# Patient Record
Sex: Female | Born: 1995 | Race: Black or African American | Hispanic: No | Marital: Single | Smoking: Never smoker
Health system: Southern US, Community
[De-identification: ages and names within clinical notes are randomized; demographics above are authoritative.]

---

## 2017-06-07 ENCOUNTER — Other Ambulatory Visit: Payer: Self-pay

## 2017-06-07 ENCOUNTER — Encounter (HOSPITAL_COMMUNITY): Payer: Self-pay | Admitting: *Deleted

## 2017-06-07 ENCOUNTER — Ambulatory Visit (HOSPITAL_COMMUNITY)
Admission: EM | Admit: 2017-06-07 | Discharge: 2017-06-07 | Disposition: A | Discharge status: Home / Self Care | Payer: PRIVATE HEALTH INSURANCE

## 2017-06-07 DIAGNOSIS — R1084 Generalized abdominal pain: Secondary | ICD-10-CM | POA: Diagnosis not present

## 2017-06-07 NOTE — ED Triage Notes (Signed)
Reports waking up with intermittent upper abd pain and mid back pain.  Reports improvement when standing.  Denies fever.  C/O nausea, but denies vomiting or diarrhea.

## 2017-06-07 NOTE — Discharge Instructions (Signed)
Please get some MiraLAX and pursue a bowel prep.  Come back if you are having any problems.

## 2017-06-07 NOTE — ED Provider Notes (Signed)
06/07/2017 6:20 PM   DOB: 05/23/1995 / MRN: [id number]  SUBJECTIVE:  Maria Palmer is a 22 y.o. female presenting for abdominal pain.  States the pain is generalized.  Also states the pain waxes and wanes.  She denies fever, chills, nausea.  Food or drink do not make the pain worse.  Last bowel movement was this morning and largely normal for her.  Denies any difficulty with urination and is not urinating more frequently or urgently.  She did drink alcohol 3 nights ago.  She has never had surgery on her abdomen.  She has No Known Allergies.   She  has no past medical history on file.    She  reports that  has never smoked. she has never used smokeless tobacco. She reports that she drinks alcohol. She reports that she does not use drugs. She  reports that she does not engage in sexual activity. The patient  has no past surgical history on file.  Her family history includes Hypertension in her father and mother.  Review of Systems  Constitutional: Negative for chills, diaphoresis and fever.  Respiratory: Negative for cough, hemoptysis, sputum production, shortness of breath and wheezing.   Cardiovascular: Negative for chest pain, orthopnea and leg swelling.  Gastrointestinal: Positive for abdominal pain. Negative for blood in stool, constipation, diarrhea, heartburn, melena, nausea and vomiting.  Genitourinary: Negative for flank pain.  Skin: Negative for rash.  Neurological: Negative for dizziness.    OBJECTIVE:  BP 136/84   Pulse (!) 110   Temp 98.3 F (36.8 C) (Oral)   Resp 18   LMP 05/31/2017 (Approximate)   SpO2 100%   Physical Exam  Constitutional: She is active.  Non-toxic appearance.  Cardiovascular: Normal rate, regular rhythm, S1 normal, S2 normal, normal heart sounds and intact distal pulses. Exam reveals no gallop, no friction rub and no decreased pulses.  No murmur heard. Pulmonary/Chest: Effort normal. No stridor. No tachypnea. No respiratory distress. She has no  wheezes. She has no rales.  Abdominal: Soft. Normal appearance and bowel sounds are normal. She exhibits no distension and no mass. There is no tenderness. There is no rigidity, no rebound, no guarding and no CVA tenderness.  Musculoskeletal: She exhibits no edema.  Neurological: She is alert.  Skin: Skin is warm and dry. She is not diaphoretic. No pallor.    No results found for this or any previous visit (from the past 72 hour(s)).  No results found.  ASSESSMENT AND PLAN:  No orders of the defined types were placed in this encounter.    Generalized abdominal pain: She is well-appearing and has a very reassuring abdominal exam.  I rechecked her heart rate on auscultation and this was 96 bpm.  I have a very low suspicion for pancreatitis, cholecystitis.  Advised that she pursue a bland diet for the next few days and try some MiraLAX.  RTC if she is not improving.      The patient is advised to call or return to clinic if she does not see an improvement in symptoms, or to seek the care of the closest emergency department if she worsens with the above plan.   Deliah BostonMichael Kaaren Nass, MHS, PA-C 06/07/2017 6:20 PM    Ofilia Neaslark, Caria Transue L, PA-C 06/07/17 1821

## 2019-03-13 ENCOUNTER — Other Ambulatory Visit: Payer: Self-pay

## 2019-03-13 ENCOUNTER — Observation Stay (HOSPITAL_COMMUNITY)
Admission: EM | Admit: 2019-03-13 | Discharge: 2019-03-15 | Disposition: A | Discharge status: Home / Self Care | Payer: BC Managed Care – PPO | Attending: General Surgery | Admitting: General Surgery

## 2019-03-13 ENCOUNTER — Encounter (HOSPITAL_COMMUNITY): Payer: Self-pay

## 2019-03-13 DIAGNOSIS — K219 Gastro-esophageal reflux disease without esophagitis: Secondary | ICD-10-CM | POA: Diagnosis not present

## 2019-03-13 DIAGNOSIS — Z20828 Contact with and (suspected) exposure to other viral communicable diseases: Secondary | ICD-10-CM | POA: Diagnosis not present

## 2019-03-13 DIAGNOSIS — Z23 Encounter for immunization: Secondary | ICD-10-CM | POA: Insufficient documentation

## 2019-03-13 DIAGNOSIS — K8020 Calculus of gallbladder without cholecystitis without obstruction: Secondary | ICD-10-CM | POA: Diagnosis present

## 2019-03-13 DIAGNOSIS — R7401 Elevation of levels of liver transaminase levels: Secondary | ICD-10-CM

## 2019-03-13 DIAGNOSIS — K760 Fatty (change of) liver, not elsewhere classified: Secondary | ICD-10-CM | POA: Diagnosis not present

## 2019-03-13 DIAGNOSIS — Z6841 Body Mass Index (BMI) 40.0 and over, adult: Secondary | ICD-10-CM | POA: Diagnosis not present

## 2019-03-13 DIAGNOSIS — R7989 Other specified abnormal findings of blood chemistry: Secondary | ICD-10-CM | POA: Diagnosis not present

## 2019-03-13 DIAGNOSIS — R1010 Upper abdominal pain, unspecified: Secondary | ICD-10-CM

## 2019-03-13 DIAGNOSIS — Z791 Long term (current) use of non-steroidal anti-inflammatories (NSAID): Secondary | ICD-10-CM | POA: Insufficient documentation

## 2019-03-13 DIAGNOSIS — Z8249 Family history of ischemic heart disease and other diseases of the circulatory system: Secondary | ICD-10-CM | POA: Diagnosis not present

## 2019-03-13 DIAGNOSIS — K8010 Calculus of gallbladder with chronic cholecystitis without obstruction: Secondary | ICD-10-CM | POA: Diagnosis not present

## 2019-03-13 DIAGNOSIS — Z8711 Personal history of peptic ulcer disease: Secondary | ICD-10-CM | POA: Insufficient documentation

## 2019-03-13 DIAGNOSIS — K8050 Calculus of bile duct without cholangitis or cholecystitis without obstruction: Secondary | ICD-10-CM | POA: Diagnosis present

## 2019-03-13 LAB — URINALYSIS, ROUTINE W REFLEX MICROSCOPIC
Bacteria, UA: NONE SEEN
Bilirubin Urine: NEGATIVE
Glucose, UA: NEGATIVE mg/dL
Hgb urine dipstick: NEGATIVE
Ketones, ur: NEGATIVE mg/dL
Nitrite: NEGATIVE
Protein, ur: 100 mg/dL — AB
Specific Gravity, Urine: 1.03 (ref 1.005–1.030)
pH: 7 (ref 5.0–8.0)

## 2019-03-13 MED ORDER — SODIUM CHLORIDE 0.9% FLUSH: 3.0000 mL | Freq: Once | INTRAVENOUS | Status: DC

## 2019-03-13 NOTE — ED Triage Notes (Signed)
Patient arrived with complaints of upper mid abdominal pain that started after eating this afternoon around 330pm. States she has had a stomach ulcer in the past and this is presenting the same. Reports some nausea, declines any vomiting or diarrhea.

## 2019-03-13 NOTE — ED Provider Notes (Signed)
[facility] DEPT Provider Note   CSN: [id number] Arrival date & time: 03/13/19  1943     History Chief Complaint  Patient presents with  . Abdominal Pain    Maria Palmer is a 23 y.o. female.  The history is provided by the patient and medical records.    23 year old female with history of peptic ulcer disease, presenting to the ED with abdominal pain.  Patient states she went out to light breakfast with friends today, ate pancakes, bacon, eggs, toast, etc.  States shortly after eating she had onset of epigastric abdominal pain, sharp, stabbing in nature with some radiation to the back.  She reports nausea but denies vomiting.  She states it feels just like when she had an ulcer back in September 2020.  She denies fever/chills.  States she was temporarily on medications for acid reflux, has run out and not currently on anything.  States since arrival in the ED symptoms have resolved.  She did admit to drinking alcohol recently when out with friends for her birthday and thinks she aggravated her PUD.  History reviewed. No pertinent past medical history.  There are no problems to display for this patient.   History reviewed. No pertinent surgical history.   OB History   No obstetric history on file.     Family History  Problem Relation Age of Onset  . Hypertension Mother   . Hypertension Father     Social History   Tobacco Use  . Smoking status: Never Smoker  . Smokeless tobacco: Never Used  . Tobacco comment: occasionally smokes [place]  Substance Use Topics  . Alcohol use: Yes    Comment: occasionally  . Drug use: No    Home Medications Prior to Admission medications   Not on File    Allergies    Patient has no known allergies.  Review of Systems   Review of Systems  Gastrointestinal: Positive for abdominal pain and nausea.  All other systems reviewed and are negative.   Physical Exam Updated Vital Signs BP (!) 142/87  (BP Location: Left Arm)   Pulse (!) 122   Temp 98.8 F (37.1 C)   Resp 16   LMP 02/21/2019   SpO2 100%   Physical Exam Vitals and nursing note reviewed.  Constitutional:      Appearance: She is well-developed.  HENT:     Head: Normocephalic and atraumatic.  Eyes:     Conjunctiva/sclera: Conjunctivae normal.     Pupils: Pupils are equal, round, and reactive to light.  Cardiovascular:     Rate and Rhythm: Normal rate and regular rhythm.     Heart sounds: Normal heart sounds.  Pulmonary:     Effort: Pulmonary effort is normal.     Breath sounds: Normal breath sounds.  Abdominal:     General: Bowel sounds are normal.     Palpations: Abdomen is soft.     Tenderness: There is no abdominal tenderness. There is no rebound.     Comments: Soft, non-tender, no peritoneal signs  Musculoskeletal:        General: Normal range of motion.     Cervical back: Normal range of motion.  Skin:    General: Skin is warm and dry.  Neurological:     Mental Status: She is alert and oriented to person, place, and time.     ED Results / Procedures / Treatments   Labs (all labs ordered are listed, but only abnormal results are displayed) Labs  Reviewed  COMPREHENSIVE METABOLIC PANEL - Abnormal; Notable for the following components:      Result Value   Glucose, Bld 158 (*)    Total Protein 9.6 (*)    AST 477 (*)    ALT 309 (*)    All other components within normal limits  URINALYSIS, ROUTINE W REFLEX MICROSCOPIC - Abnormal; Notable for the following components:   Color, Urine AMBER (*)    APPearance HAZY (*)    Protein, ur 100 (*)    Leukocytes,Ua TRACE (*)    All other components within normal limits  CBC - Abnormal; Notable for the following components:   WBC 17.2 (*)    RBC 5.28 (*)    Platelets 143 (*)    All other components within normal limits  SARS CORONAVIRUS 2 (TAT 6-24 HRS)  LIPASE, BLOOD  COMPREHENSIVE METABOLIC PANEL  HIV ANTIBODY (ROUTINE TESTING W REFLEX)  CBC  I-STAT  BETA HCG BLOOD, ED ([facility], [facility], [facility] ONLY)    EKG None  Radiology US Abdomen Limited RUQ  Result Date: 03/14/2019 CLINICAL DATA:  Upper abdominal pain. Pain after eating. Elevated LFTs. EXAM: ULTRASOUND ABDOMEN LIMITED RIGHT UPPER QUADRANT COMPARISON:  None. FINDINGS: Gallbladder: Partially distended. Multiple intraluminal gallstones. No gallbladder wall thickening or pericholecystic fluid. No sonographic Murphy sign noted by sonographer. Common bile duct: Diameter: 6-7 mm, upper normal.  No visualized choledocholithiasis. Liver: No focal lesion identified. Heterogeneous slightly increased in parenchymal echogenicity. Portal vein is patent on color Doppler imaging with normal direction of blood flow towards the liver. Other: None. IMPRESSION: 1. Multiple gallstones. No secondary findings of acute cholecystitis. 2. Upper normal common bile duct measuring 6-7 mm. No visualized choledocholithiasis. Consider further evaluation with MRCP given elevated LFTs. 3. Hepatic steatosis. Electronically Signed   By: Keith Rake M.D.   On: 03/14/2019 02:26    Procedures Procedures (including critical care time)  Medications Ordered in ED Medications  acetaminophen (TYLENOL) tablet 650 mg (has no administration in time range)    Or  acetaminophen (TYLENOL) suppository 650 mg (has no administration in time range)  ondansetron (ZOFRAN) tablet 4 mg (has no administration in time range)    Or  ondansetron (ZOFRAN) injection 4 mg (has no administration in time range)  0.9 %  sodium chloride infusion (has no administration in time range)  Ampicillin-Sulbactam (UNASYN) 3 g in sodium chloride 0.9 % 100 mL IVPB (has no administration in time range)    ED Course  I have reviewed the triage vital signs and the nursing notes.  Pertinent labs & imaging results that were available during my care of the patient were reviewed by me and considered in my medical decision making (see chart for details).    MDM  Rules/Calculators/A&P  23 year old female here with upper abdominal pain after eating brunch today with friends.  States has a history of peptic ulcer in the past and this feels similar.  Pain resolved prior to my evaluation.  Reports pain was epigastric in nature with some radiation to the back.  She did have some nausea but no vomiting.  Her abdomen is soft and benign at this time.  Labs are pending.  Labs reviewed, does have leukocytosis of 17.2K along with transaminitis-- AST 477, ALT 309.  Normal alk phos and bili.  Denies heavy EtOH use in the past week or so.  No tylenol use.  Not on statins.  No hx of elevated LFT's in the past.  Will obtain RUQ [place].  2:51 AM  [place] results reviewed--- questionable choledocholithiasis given dilated common bile duct and elevated LFTs.  Recommended MRCP.  Patient reassessed, she is still not having any discomfort and vitals are stable.  She does not currently have a PCP nor a GI physician.  We will plan for admission to expedite evaluation by GI +/- general surgery and arrange outpatient follow-up.  Discussed with Dr. Alcario Drought-- he will admit for ongoing care.  COVID screen pending but currently asymptomatic.  Final Clinical Impression(s) / ED Diagnoses Final diagnoses:  Upper abdominal pain  Gallstones  Transaminitis    Rx / DC Orders ED Discharge Orders    None       Larene Pickett, PA-C 03/14/19 Miquel Dunn, MD 03/14/19 [phone]

## 2019-03-13 NOTE — ED Notes (Signed)
Attempted to draw blood in triage but was unsuccessful

## 2019-03-14 ENCOUNTER — Observation Stay (HOSPITAL_COMMUNITY): Payer: BC Managed Care – PPO

## 2019-03-14 ENCOUNTER — Encounter (HOSPITAL_COMMUNITY)
Admission: EM | Disposition: A | Discharge status: Home / Self Care | Payer: Self-pay | Source: Home / Self Care | Attending: Emergency Medicine

## 2019-03-14 ENCOUNTER — Observation Stay (HOSPITAL_COMMUNITY): Payer: BC Managed Care – PPO | Admitting: Certified Registered Nurse Anesthetist

## 2019-03-14 ENCOUNTER — Emergency Department (HOSPITAL_COMMUNITY): Payer: BC Managed Care – PPO

## 2019-03-14 ENCOUNTER — Encounter (HOSPITAL_COMMUNITY): Payer: Self-pay | Admitting: Internal Medicine

## 2019-03-14 DIAGNOSIS — R7401 Elevation of levels of liver transaminase levels: Secondary | ICD-10-CM | POA: Diagnosis not present

## 2019-03-14 DIAGNOSIS — R1010 Upper abdominal pain, unspecified: Secondary | ICD-10-CM | POA: Diagnosis not present

## 2019-03-14 DIAGNOSIS — K8020 Calculus of gallbladder without cholecystitis without obstruction: Secondary | ICD-10-CM

## 2019-03-14 DIAGNOSIS — K8050 Calculus of bile duct without cholangitis or cholecystitis without obstruction: Secondary | ICD-10-CM

## 2019-03-14 HISTORY — PX: CHOLECYSTECTOMY: SHX55

## 2019-03-14 LAB — COMPREHENSIVE METABOLIC PANEL
ALT: 309 U/L — ABNORMAL HIGH (ref 0–44)
ALT: 249 U/L — ABNORMAL HIGH (ref 0–44)
AST: 477 U/L — ABNORMAL HIGH (ref 15–41)
AST: 246 U/L — ABNORMAL HIGH (ref 15–41)
Albumin: 4.7 g/dL (ref 3.5–5.0)
Albumin: 3.8 g/dL (ref 3.5–5.0)
Alkaline Phosphatase: 105 U/L (ref 38–126)
Alkaline Phosphatase: 85 U/L (ref 38–126)
Anion gap: 8 (ref 5–15)
Anion gap: 9 (ref 5–15)
BUN: 9 mg/dL (ref 6–20)
BUN: 8 mg/dL (ref 6–20)
CO2: 28 mmol/L (ref 22–32)
CO2: 26 mmol/L (ref 22–32)
Calcium: 10.2 mg/dL (ref 8.9–10.3)
Calcium: 9.4 mg/dL (ref 8.9–10.3)
Chloride: 104 mmol/L (ref 98–111)
Chloride: 105 mmol/L (ref 98–111)
Creatinine, Ser: 0.5 mg/dL (ref 0.44–1.00)
Creatinine, Ser: 0.66 mg/dL (ref 0.44–1.00)
GFR calc Af Amer: >60 mL/min (ref >60)
GFR calc Af Amer: >60 mL/min (ref >60)
GFR calc non Af Amer: >60 mL/min (ref >60)
GFR calc non Af Amer: >60 mL/min (ref >60)
Glucose, Bld: 158 mg/dL — ABNORMAL HIGH (ref 70–99)
Glucose, Bld: 118 mg/dL — ABNORMAL HIGH (ref 70–99)
Potassium: 3.7 mmol/L (ref 3.5–5.1)
Potassium: 3.8 mmol/L (ref 3.5–5.1)
Sodium: 140 mmol/L (ref 135–145)
Sodium: 140 mmol/L (ref 135–145)
Total Bilirubin: 0.9 mg/dL (ref 0.3–1.2)
Total Bilirubin: 0.8 mg/dL (ref 0.3–1.2)
Total Protein: 9.6 g/dL — ABNORMAL HIGH (ref 6.5–8.1)
Total Protein: 7.9 g/dL (ref 6.5–8.1)

## 2019-03-14 LAB — CBC
HCT: 44.9 % (ref 36.0–46.0)
HCT: 37.2 % (ref 36.0–46.0)
Hemoglobin: 14 g/dL (ref 12.0–15.0)
Hemoglobin: 11.3 g/dL — ABNORMAL LOW (ref 12.0–15.0)
MCH: 26.5 pg (ref 26.0–34.0)
MCH: 25.6 pg — ABNORMAL LOW (ref 26.0–34.0)
MCHC: 31.2 g/dL (ref 30.0–36.0)
MCHC: 30.4 g/dL (ref 30.0–36.0)
MCV: 85 fL (ref 80.0–100.0)
MCV: 84.4 fL (ref 80.0–100.0)
Platelets: 143 10*3/uL — ABNORMAL LOW (ref 150–400)
Platelets: 173 10*3/uL (ref 150–400)
RBC: 5.28 MIL/uL — ABNORMAL HIGH (ref 3.87–5.11)
RBC: 4.41 MIL/uL (ref 3.87–5.11)
RDW: 14.2 % (ref 11.5–15.5)
RDW: 14.2 % (ref 11.5–15.5)
WBC: 17.2 10*3/uL — ABNORMAL HIGH (ref 4.0–10.5)
WBC: 13.2 10*3/uL — ABNORMAL HIGH (ref 4.0–10.5)
nRBC: 0 % (ref 0.0–0.2)
nRBC: 0 % (ref 0.0–0.2)

## 2019-03-14 LAB — SARS CORONAVIRUS 2 (TAT 6-24 HRS): SARS Coronavirus 2: NEGATIVE

## 2019-03-14 LAB — HIV ANTIBODY (ROUTINE TESTING W REFLEX): HIV Screen 4th Generation wRfx: NONREACTIVE

## 2019-03-14 LAB — LIPASE, BLOOD: Lipase: 22 U/L (ref 11–51)

## 2019-03-14 LAB — I-STAT BETA HCG BLOOD, ED (MC, WL, AP ONLY): I-stat hCG, quantitative: <5 m[IU]/mL (ref <5)

## 2019-03-14 SURGERY — LAPAROSCOPIC CHOLECYSTECTOMY WITH INTRAOPERATIVE CHOLANGIOGRAM
Anesthesia: General

## 2019-03-14 MED ORDER — ACETAMINOPHEN 650 MG RE SUPP: 650.0000 mg | Freq: Four times a day (QID) | RECTAL | Status: DC | PRN

## 2019-03-14 MED ORDER — INFLUENZA VAC SPLIT QUAD 0.5 ML IM SUSY
0.5000 mL | PREFILLED_SYRINGE | INTRAMUSCULAR | Status: AC
  Administered 2019-03-15: 0.5 mL via INTRAMUSCULAR

## 2019-03-14 MED ORDER — FENTANYL CITRATE (PF) 100 MCG/2ML IJ SOLN
25.0000 ug | INTRAMUSCULAR | Status: DC | PRN
  Administered 2019-03-14 (×3): 50 ug via INTRAVENOUS

## 2019-03-14 MED ORDER — ROCURONIUM BROMIDE 50 MG/5ML IV SOSY
PREFILLED_SYRINGE | INTRAVENOUS | Status: DC | PRN
  Administered 2019-03-14: 5 mg via INTRAVENOUS
  Administered 2019-03-14: 50 mg via INTRAVENOUS
  Administered 2019-03-14: 15 mg via INTRAVENOUS

## 2019-03-14 MED ORDER — ONDANSETRON HCL 4 MG/2ML IJ SOLN: 4.0000 mg | Freq: Four times a day (QID) | INTRAMUSCULAR | Status: DC | PRN

## 2019-03-14 MED ORDER — SODIUM CHLORIDE 0.9 % IV SOLN
INTRAVENOUS | Status: DC
  Administered 2019-03-14 (×2): via INTRAVENOUS

## 2019-03-14 MED ORDER — OXYCODONE HCL 5 MG PO TABS: 5.0000 mg | ORAL_TABLET | ORAL | Status: DC | PRN

## 2019-03-14 MED ORDER — ACETAMINOPHEN 325 MG PO TABS: 650.0000 mg | ORAL_TABLET | Freq: Four times a day (QID) | ORAL | Status: DC | PRN

## 2019-03-14 MED ORDER — PROPOFOL 10 MG/ML IV BOLUS
INTRAVENOUS | Status: DC | PRN
  Administered 2019-03-14: 200 mg via INTRAVENOUS

## 2019-03-14 MED ORDER — OXYCODONE HCL 5 MG/5ML PO SOLN: 5.0000 mg | Freq: Once | ORAL | Status: DC | PRN

## 2019-03-14 MED ORDER — BUPIVACAINE HCL (PF) 0.25 % IJ SOLN
INTRAMUSCULAR | Status: DC | PRN
  Administered 2019-03-14: 30 mL

## 2019-03-14 MED ORDER — MIDAZOLAM HCL 5 MG/5ML IJ SOLN
INTRAMUSCULAR | Status: DC | PRN
  Administered 2019-03-14: 2 mg via INTRAVENOUS

## 2019-03-14 MED ORDER — LIP MEDEX EX OINT
TOPICAL_OINTMENT | CUTANEOUS | Status: AC | PRN
  Administered 2019-03-14: 1 via TOPICAL
  Filled 2019-03-14: qty 7

## 2019-03-14 MED ORDER — HYDROMORPHONE HCL 1 MG/ML IJ SOLN
0.2500 mg | INTRAMUSCULAR | Status: DC | PRN
  Administered 2019-03-14 (×3): 0.5 mg via INTRAVENOUS

## 2019-03-14 MED ORDER — FENTANYL CITRATE (PF) 250 MCG/5ML IJ SOLN
INTRAMUSCULAR | Status: AC
  Filled 2019-03-14: qty 5

## 2019-03-14 MED ORDER — LACTATED RINGERS IV SOLN
INTRAVENOUS | Status: AC | PRN
  Administered 2019-03-14: 1000 mL

## 2019-03-14 MED ORDER — ONDANSETRON HCL 4 MG/2ML IJ SOLN
INTRAMUSCULAR | Status: AC
  Filled 2019-03-14: qty 2

## 2019-03-14 MED ORDER — HYDROMORPHONE HCL 1 MG/ML IJ SOLN
INTRAMUSCULAR | Status: AC
  Filled 2019-03-14: qty 2

## 2019-03-14 MED ORDER — 0.9 % SODIUM CHLORIDE (POUR BTL) OPTIME
TOPICAL | Status: DC | PRN
  Administered 2019-03-14: 1000 mL

## 2019-03-14 MED ORDER — SUGAMMADEX SODIUM 500 MG/5ML IV SOLN
INTRAVENOUS | Status: DC | PRN
  Administered 2019-03-14: 300 mg via INTRAVENOUS

## 2019-03-14 MED ORDER — ONDANSETRON HCL 4 MG PO TABS: 4.0000 mg | ORAL_TABLET | Freq: Four times a day (QID) | ORAL | Status: DC | PRN

## 2019-03-14 MED ORDER — ONDANSETRON HCL 4 MG/2ML IJ SOLN
INTRAMUSCULAR | Status: DC | PRN
  Administered 2019-03-14: 4 mg via INTRAVENOUS

## 2019-03-14 MED ORDER — SODIUM CHLORIDE 0.9% FLUSH: 10.0000 mL | INTRAVENOUS | Status: DC | PRN

## 2019-03-14 MED ORDER — SODIUM CHLORIDE 0.9 % IV SOLN
3.0000 g | Freq: Four times a day (QID) | INTRAVENOUS | Status: DC
  Administered 2019-03-14 (×2): 3 g via INTRAVENOUS
  Filled 2019-03-14 (×2): qty 8
  Filled 2019-03-14: qty 3

## 2019-03-14 MED ORDER — BUPIVACAINE HCL (PF) 0.25 % IJ SOLN
INTRAMUSCULAR | Status: AC
  Filled 2019-03-14: qty 30

## 2019-03-14 MED ORDER — ACETAMINOPHEN 500 MG PO TABS: 1000.0000 mg | ORAL_TABLET | ORAL | Status: DC

## 2019-03-14 MED ORDER — LIDOCAINE 2% (20 MG/ML) 5 ML SYRINGE
INTRAMUSCULAR | Status: DC | PRN
  Administered 2019-03-14: 100 mg via INTRAVENOUS

## 2019-03-14 MED ORDER — ROCURONIUM BROMIDE 10 MG/ML (PF) SYRINGE
PREFILLED_SYRINGE | INTRAVENOUS | Status: AC
  Filled 2019-03-14: qty 10

## 2019-03-14 MED ORDER — FENTANYL CITRATE (PF) 250 MCG/5ML IJ SOLN
INTRAMUSCULAR | Status: DC | PRN
  Administered 2019-03-14 (×4): 50 ug via INTRAVENOUS

## 2019-03-14 MED ORDER — MIDAZOLAM HCL 2 MG/2ML IJ SOLN
INTRAMUSCULAR | Status: AC
  Filled 2019-03-14: qty 2

## 2019-03-14 MED ORDER — IBUPROFEN 400 MG PO TABS
600.0000 mg | ORAL_TABLET | Freq: Three times a day (TID) | ORAL | Status: DC
  Administered 2019-03-14 – 2019-03-15 (×3): 600 mg via ORAL
  Filled 2019-03-14 (×3): qty 1

## 2019-03-14 MED ORDER — MORPHINE SULFATE (PF) 2 MG/ML IV SOLN: 1.0000 mg | INTRAVENOUS | Status: DC | PRN

## 2019-03-14 MED ORDER — ALUM & MAG HYDROXIDE-SIMETH 200-200-20 MG/5ML PO SUSP
30.0000 mL | ORAL | Status: DC | PRN
  Administered 2019-03-14: 30 mL via ORAL
  Filled 2019-03-14: qty 30

## 2019-03-14 MED ORDER — PROPOFOL 10 MG/ML IV BOLUS
INTRAVENOUS | Status: AC
  Filled 2019-03-14: qty 20

## 2019-03-14 MED ORDER — SODIUM CHLORIDE 0.9 % IV SOLN
INTRAVENOUS | Status: DC | PRN
  Administered 2019-03-14: 4 mL

## 2019-03-14 MED ORDER — LACTATED RINGERS IV SOLN
INTRAVENOUS | Status: DC
  Administered 2019-03-14: 14:00:00 via INTRAVENOUS

## 2019-03-14 MED ORDER — SODIUM CHLORIDE 0.9 % IV SOLN
INTRAVENOUS | Status: DC
  Administered 2019-03-14: 18:00:00 via INTRAVENOUS

## 2019-03-14 MED ORDER — SODIUM CHLORIDE 0.9% FLUSH
10.0000 mL | Freq: Two times a day (BID) | INTRAVENOUS | Status: DC
  Administered 2019-03-14: 10 mL

## 2019-03-14 MED ORDER — ENOXAPARIN SODIUM 40 MG/0.4ML ~~LOC~~ SOLN: 40.0000 mg | SUBCUTANEOUS | Status: DC

## 2019-03-14 MED ORDER — LIDOCAINE 2% (20 MG/ML) 5 ML SYRINGE
INTRAMUSCULAR | Status: AC
  Filled 2019-03-14: qty 5

## 2019-03-14 MED ORDER — ACETAMINOPHEN 500 MG PO TABS
1000.0000 mg | ORAL_TABLET | Freq: Four times a day (QID) | ORAL | Status: AC
  Administered 2019-03-14: 1000 mg via ORAL
  Filled 2019-03-14: qty 2

## 2019-03-14 MED ORDER — OXYCODONE HCL 5 MG PO TABS: 5.0000 mg | ORAL_TABLET | Freq: Once | ORAL | Status: DC | PRN

## 2019-03-14 MED ORDER — SUGAMMADEX SODIUM 500 MG/5ML IV SOLN
INTRAVENOUS | Status: AC
  Filled 2019-03-14: qty 5

## 2019-03-14 MED ORDER — DEXAMETHASONE SODIUM PHOSPHATE 10 MG/ML IJ SOLN
INTRAMUSCULAR | Status: AC
  Filled 2019-03-14: qty 1

## 2019-03-14 MED ORDER — DEXAMETHASONE SODIUM PHOSPHATE 10 MG/ML IJ SOLN
INTRAMUSCULAR | Status: DC | PRN
  Administered 2019-03-14: 10 mg via INTRAVENOUS

## 2019-03-14 MED ORDER — KETOROLAC TROMETHAMINE 15 MG/ML IJ SOLN
INTRAMUSCULAR | Status: DC | PRN
  Administered 2019-03-14: 15 mg via INTRAVENOUS

## 2019-03-14 MED ORDER — FENTANYL CITRATE (PF) 100 MCG/2ML IJ SOLN
INTRAMUSCULAR | Status: AC
  Filled 2019-03-14: qty 4

## 2019-03-14 SURGICAL SUPPLY — 51 items
APPLICATOR ARISTA FLEXITIP XL (MISCELLANEOUS) IMPLANT
APPLIER CLIP 5 13 M/L LIGAMAX5 (MISCELLANEOUS)
APPLIER CLIP ROT 10 11.4 M/L (STAPLE)
BENZOIN TINCTURE PRP APPL 2/3 (GAUZE/BANDAGES/DRESSINGS) IMPLANT
BNDG ADH 1X3 SHEER STRL LF (GAUZE/BANDAGES/DRESSINGS) ×8 IMPLANT
CABLE HIGH FREQUENCY MONO STRZ (ELECTRODE) ×2 IMPLANT
CHLORAPREP W/TINT 26 (MISCELLANEOUS) ×2 IMPLANT
CLIP APPLIE 5 13 M/L LIGAMAX5 (MISCELLANEOUS) IMPLANT
CLIP APPLIE ROT 10 11.4 M/L (STAPLE) IMPLANT
CLIP VESOLOCK MED LG 6/CT (CLIP) IMPLANT
COVER MAYO STAND STRL (DRAPES) IMPLANT
COVER SURGICAL LIGHT HANDLE (MISCELLANEOUS) ×2 IMPLANT
COVER WAND RF STERILE (DRAPES) IMPLANT
DECANTER SPIKE VIAL GLASS SM (MISCELLANEOUS) ×2 IMPLANT
DERMABOND ADVANCED (GAUZE/BANDAGES/DRESSINGS)
DERMABOND ADVANCED .7 DNX12 (GAUZE/BANDAGES/DRESSINGS) IMPLANT
DRAPE C-ARM 42X120 X-RAY (DRAPES) IMPLANT
DRSG TEGADERM 2-3/8X2-3/4 SM (GAUZE/BANDAGES/DRESSINGS) IMPLANT
ELECT REM PT RETURN 15FT ADLT (MISCELLANEOUS) ×2 IMPLANT
GAUZE SPONGE 2X2 8PLY STRL LF (GAUZE/BANDAGES/DRESSINGS) IMPLANT
GLOVE BIO SURGEON STRL SZ7.5 (GLOVE) ×2 IMPLANT
GLOVE INDICATOR 8.0 STRL GRN (GLOVE) ×2 IMPLANT
GOWN STRL REUS W/TWL XL LVL3 (GOWN DISPOSABLE) ×6 IMPLANT
GRASPER SUT TROCAR 14GX15 (MISCELLANEOUS) ×2 IMPLANT
HEMOSTAT ARISTA ABSORB 3G PWDR (HEMOSTASIS) IMPLANT
HEMOSTAT SNOW SURGICEL 2X4 (HEMOSTASIS) IMPLANT
KIT BASIN OR (CUSTOM PROCEDURE TRAY) ×2 IMPLANT
KIT TURNOVER KIT A (KITS) IMPLANT
L-HOOK LAP DISP 36CM (ELECTROSURGICAL)
LHOOK LAP DISP 36CM (ELECTROSURGICAL) IMPLANT
POUCH RETRIEVAL ECOSAC 10 (ENDOMECHANICALS) ×1 IMPLANT
POUCH RETRIEVAL ECOSAC 10MM (ENDOMECHANICALS) ×1
SCISSORS LAP 5X35 DISP (ENDOMECHANICALS) ×2 IMPLANT
SET CHOLANGIOGRAPH MIX (MISCELLANEOUS) IMPLANT
SET IRRIG TUBING LAPAROSCOPIC (IRRIGATION / IRRIGATOR) ×2 IMPLANT
SET TUBE SMOKE EVAC HIGH FLOW (TUBING) ×2 IMPLANT
SLEEVE XCEL OPT CAN 5 100 (ENDOMECHANICALS) ×4 IMPLANT
SPONGE GAUZE 2X2 STER 10/PKG (GAUZE/BANDAGES/DRESSINGS)
STRIP CLOSURE SKIN 1/2X4 (GAUZE/BANDAGES/DRESSINGS) IMPLANT
SUT MNCRL AB 4-0 PS2 18 (SUTURE) ×2 IMPLANT
SUT VIC AB 0 UR5 27 (SUTURE) IMPLANT
SUT VICRYL 0 TIES 12 18 (SUTURE) ×2 IMPLANT
SUT VICRYL 0 UR6 27IN ABS (SUTURE) ×4 IMPLANT
SYR BULB IRRIGATION 50ML (SYRINGE) ×2 IMPLANT
TAPE STRIPS DRAPE STRL (GAUZE/BANDAGES/DRESSINGS) ×2 IMPLANT
TOWEL OR 17X26 10 PK STRL BLUE (TOWEL DISPOSABLE) ×2 IMPLANT
TOWEL OR NON WOVEN STRL DISP B (DISPOSABLE) ×2 IMPLANT
TRAY LAPAROSCOPIC (CUSTOM PROCEDURE TRAY) ×2 IMPLANT
TROCAR BLADELESS OPT 5 100 (ENDOMECHANICALS) ×2 IMPLANT
TROCAR XCEL BLUNT TIP 100MML (ENDOMECHANICALS) IMPLANT
TROCAR XCEL NON-BLD 11X100MML (ENDOMECHANICALS) IMPLANT

## 2019-03-14 NOTE — Progress Notes (Signed)
Pharmacy Antibiotic Note  Maria Palmer is a 23 y.o. female admitted on 03/13/2019 with intra-abdominal infection.  Pharmacy has been consulted for unasyn dosing.  Plan: Unasyn 3 Gm IV q6h F/u scr/cultures     Temp (24hrs), Avg:99 F (37.2 C), Min:98.8 F (37.1 C), Max:99.1 F (37.3 C)  Recent Labs  Lab 03/13/19 2348 03/14/19 0039  WBC  --  17.2*  CREATININE 0.50  --     CrCl cannot be calculated (Unknown ideal weight.).    No Known Allergies  Antimicrobials this admission: 12/14 unasyn >>    >>   Dose adjustments this admission:   Microbiology results:  BCx:   UCx:   Sputum:    MRSA PCR:   Thank you for allowing pharmacy to be a part of this patient's care.  Dorrene German 03/14/2019 3:19 AM

## 2019-03-14 NOTE — H&P (Signed)
History and Physical    Maria Palmer JJO:[id number] DOB: [birth year] DOA: 03/13/2019  PCP: Patient, No Pcp Per  Patient coming from: Home  I have personally briefly reviewed patient's old medical records in [facility]  Chief Complaint: Abd pain  HPI: Aubrionna Istre is a 23 y.o. female with medical history significant of PUD.  Patient presents to ED with c/o abd pain.  Went out to eat breakfast with friends today.  Shortly after had onset of epigastric abd pain.  Sharp, stabbing, some radiation to back.  Had nausea but no vomiting.  No fever/chills.   ED Course: Symptoms resolved in ED.  Tm 99.1, WBC 17.2k.  Lipase nl, AST 477, ALT 309.  RUQ US shows multiple gallstones.  Upper limit of normal CBD but no stone in duct identified.   Review of Systems: As per HPI, otherwise all review of systems negative.  History reviewed. No pertinent past medical history.  History reviewed. No pertinent surgical history.   reports that she has never smoked. She has never used smokeless tobacco. She reports current alcohol use. She reports that she does not use drugs.  No Known Allergies  Family History  Problem Relation Age of Onset  . Hypertension Mother   . Hypertension Father      Prior to Admission medications   Not on File    Physical Exam: Vitals:   03/14/19 0100 03/14/19 0200 03/14/19 0230 03/14/19 0300  BP: (!) 141/90 129/84 126/83 130/73  Pulse: (!) 102 81 86 81  Resp: 18 18 16 20   Temp:      TempSrc:      SpO2: 100% 100%  100%    Constitutional: NAD, calm, comfortable Eyes: PERRL, lids and conjunctivae normal ENMT: Mucous membranes are moist. Posterior pharynx clear of any exudate or lesions.Normal dentition.  Neck: normal, supple, no masses, no thyromegaly Respiratory: clear to auscultation bilaterally, no wheezing, no crackles. Normal respiratory effort. No accessory muscle use.  Cardiovascular: Regular rate and rhythm, no murmurs / rubs /  gallops. No extremity edema. 2+ pedal pulses. No carotid bruits.  Abdomen: no tenderness, no masses palpated. No hepatosplenomegaly. Bowel sounds positive.  Musculoskeletal: no clubbing / cyanosis. No joint deformity upper and lower extremities. Good ROM, no contractures. Normal muscle tone.  Skin: no rashes, lesions, ulcers. No induration Neurologic: CN 2-12 grossly intact. Sensation intact, DTR normal. Strength 5/5 in all 4.  Psychiatric: Normal judgment and insight. Alert and oriented x 3. Normal mood.    Labs on Admission: I have personally reviewed following labs and imaging studies  CBC: Recent Labs  Lab 03/14/19 0039  WBC 17.2*  HGB 14.0  HCT 44.9  MCV 85.0  PLT 143*   Basic Metabolic Panel: Recent Labs  Lab 03/13/19 2348  NA 140  K 3.7  CL 104  CO2 28  GLUCOSE 158*  BUN 9  CREATININE 0.50  CALCIUM 10.2   GFR: CrCl cannot be calculated (Unknown ideal weight.). Liver Function Tests: Recent Labs  Lab 03/13/19 2348  AST 477*  ALT 309*  ALKPHOS 105  BILITOT 0.9  PROT 9.6*  ALBUMIN 4.7   Recent Labs  Lab 03/13/19 2348  LIPASE 22   No results for input(s): AMMONIA in the last 168 hours. Coagulation Profile: No results for input(s): INR, PROTIME in the last 168 hours. Cardiac Enzymes: No results for input(s): CKTOTAL, CKMB, CKMBINDEX, TROPONINI in the last 168 hours. BNP (last 3 results) No results for input(s): PROBNP in the last 8760 hours.  HbA1C: No results for input(s): HGBA1C in the last 72 hours. CBG: No results for input(s): GLUCAP in the last 168 hours. Lipid Profile: No results for input(s): CHOL, HDL, LDLCALC, TRIG, CHOLHDL, LDLDIRECT in the last 72 hours. Thyroid Function Tests: No results for input(s): TSH, T4TOTAL, FREET4, T3FREE, THYROIDAB in the last 72 hours. Anemia Panel: No results for input(s): VITAMINB12, FOLATE, FERRITIN, TIBC, IRON, RETICCTPCT in the last 72 hours. Urine analysis:    Component Value Date/Time   COLORURINE  AMBER (A) 03/13/2019 2236   APPEARANCEUR HAZY (A) 03/13/2019 2236   LABSPEC 1.030 03/13/2019 2236   PHURINE 7.0 03/13/2019 2236   GLUCOSEU NEGATIVE 03/13/2019 2236   HGBUR NEGATIVE 03/13/2019 2236   BILIRUBINUR NEGATIVE 03/13/2019 2236   KETONESUR NEGATIVE 03/13/2019 2236   PROTEINUR 100 (A) 03/13/2019 2236   NITRITE NEGATIVE 03/13/2019 2236   LEUKOCYTESUR TRACE (A) 03/13/2019 2236    Radiological Exams on Admission: US Abdomen Limited RUQ  Result Date: 03/14/2019 CLINICAL DATA:  Upper abdominal pain. Pain after eating. Elevated LFTs. EXAM: ULTRASOUND ABDOMEN LIMITED RIGHT UPPER QUADRANT COMPARISON:  None. FINDINGS: Gallbladder: Partially distended. Multiple intraluminal gallstones. No gallbladder wall thickening or pericholecystic fluid. No sonographic Murphy sign noted by sonographer. Common bile duct: Diameter: 6-7 mm, upper normal.  No visualized choledocholithiasis. Liver: No focal lesion identified. Heterogeneous slightly increased in parenchymal echogenicity. Portal vein is patent on color Doppler imaging with normal direction of blood flow towards the liver. Other: None. IMPRESSION: 1. Multiple gallstones. No secondary findings of acute cholecystitis. 2. Upper normal common bile duct measuring 6-7 mm. No visualized choledocholithiasis. Consider further evaluation with MRCP given elevated LFTs. 3. Hepatic steatosis. Electronically Signed   By: Keith Rake M.D.   On: 03/14/2019 02:26    EKG: Independently reviewed.  Assessment/Plan Active Problems:   Choledocholithiasis    1. Cholelithiasis with either choledocholithiasis vs passed stone - 1. Repeat CMP in AM 2. Will put on empiric Unasyn for the moment 3. Call GI in AM 1. Presumably may need either ERCP or MRCP 4. NPO 5. IVF: NS at 125  DVT prophylaxis: SCDs Code Status: Full Family Communication: No family in room Disposition Plan: Home after admit Consults called: None Admission status: Place in  46    Claribel Sachs, [place] [facility]  How to contact the [facility] Attending or Consulting provider [place] or covering provider during after hours [place], for this patient?  1. Check the care team in [facility] and look for a) attending/consulting [facility] provider listed and b) the [facility] team listed 2. Log into [email or web address]  Amion Physician Scheduling and messaging for groups and whole hospitals  On call and physician scheduling software for group practices, residents, hospitalists and other medical providers for call, clinic, rotation and shift schedules. OnCall [employer] is a hospital-wide system for scheduling doctors and paging doctors on call. EasyPlot is for scientific plotting and data analysis.  [email or web address]  and use [place]'s universal password to access. If you do not have the password, please contact the hospital operator.  3. Locate the [facility] provider you are looking for under [facility] and page to a number that you can be directly reached. 4. If you still have difficulty reaching the provider, please page the [facility] (Director on Call) for the Hospitalists listed on amion for assistance.  03/14/2019, 3:17 AM

## 2019-03-14 NOTE — Anesthesia Procedure Notes (Signed)
Procedure Name: Intubation Performed by: West Pugh, CRNA Pre-anesthesia Checklist: Patient identified, Emergency Drugs available, Suction available, Patient being monitored and Timeout performed Patient Re-evaluated:Patient Re-evaluated prior to induction Oxygen Delivery Method: Circle system utilized Preoxygenation: Pre-oxygenation with 100% oxygen Induction Type: IV induction Ventilation: Mask ventilation without difficulty and Oral airway inserted - appropriate to patient size Laryngoscope Size: Mac and 4 Grade View: Grade II Tube type: Oral Tube size: 7.0 mm Number of attempts: 1 Airway Equipment and Method: Stylet Placement Confirmation: ETT inserted through vocal cords under direct vision,  positive ETCO2,  CO2 detector and breath sounds checked- equal and bilateral Secured at: 21 cm Tube secured with: Tape Dental Injury: Teeth and Oropharynx as per pre-operative assessment

## 2019-03-14 NOTE — Consult Note (Signed)
Consultation  Referring Provider: [facility]/ Alcario Drought  Primary Care Physician:  Patient, No Pcp Per Primary Gastroenterologist:  None/unassigned.  Reason for Consultation: Acute severe epigastric pain  HPI: Maria Palmer is a 23 y.o. female, who was admitted to the hospital last evening after she presented with acute onset of severe epigastric pain about 2 hours after she had eaten lunch.  Pain was constant and radiating into her back.  She had associated nausea but no vomiting.  She says she did develop some diaphoresis, no fever or chills. Pain lasted between 8 to 9 hours total, resolved and has not recurred. She did have a similar but less severe episode of pain in September 2020 and was seen at an urgent care at that time but did not have any imaging or labs done.  She was told that she may have an ulcer and was given a PPI.  Work-up since admission with upper abdominal ultrasound showing multiple gallstones, no gallbladder wall thickening or "pericholecystic fluid, CBD 6 to 7 mm no visualized choledocholithiasis and normal-appearing liver  Labs; WBC 17.2, hemoglobin 14-   today WBC down to 13.2, hemoglobin 11.3 Last p.m. AST 477/ALT 309/alk phos 105 and T bili 0.9 Today AST 246/ALT 249/alk phos 85/T bili 0.8 COVID-19 pending  Patient is otherwise in good health with no known chronic medical problems. He does relate that her mother is status post remote cholecystectomy   History reviewed. No pertinent past medical history.  History reviewed. No pertinent surgical history.  Prior to Admission medications   Not on File    Current Facility-Administered Medications  Medication Dose Route Frequency Provider Last Rate Last Admin  . 0.9 %  sodium chloride infusion   Intravenous Continuous Etta Quill, DO 125 mL/hr at 03/14/19 0854 New Bag at 03/14/19 0854  . acetaminophen (TYLENOL) tablet 650 mg  650 mg Oral Q6H PRN Etta Quill, DO       Or  . acetaminophen (TYLENOL)  suppository 650 mg  650 mg Rectal Q6H PRN Etta Quill, DO      . [START ON 03/15/2019] acetaminophen (TYLENOL) tablet 1,000 mg  1,000 mg Oral On Call to OR Saverio Danker, PA-C      . Ampicillin-Sulbactam (UNASYN) 3 g in sodium chloride 0.9 % 100 mL IVPB  3 g Intravenous Q6H Dorrene German, RPH 200 mL/hr at 03/14/19 1257 3 g at 03/14/19 1257  . ondansetron (ZOFRAN) tablet 4 mg  4 mg Oral Q6H PRN Etta Quill, DO       Or  . ondansetron [facility]) injection 4 mg  4 mg Intravenous Q6H PRN Etta Quill, DO      . sodium chloride flush (NS) 0.9 % injection 10-40 mL  10-40 mL Intracatheter Q12H Etta Quill, DO   10 mL at 03/14/19 0855  . sodium chloride flush (NS) 0.9 % injection 10-40 mL  10-40 mL Intracatheter PRN Etta Quill, DO        Allergies as of 03/13/2019  . (No Known Allergies)    Family History  Problem Relation Age of Onset  . Hypertension Mother   . Hypertension Father     Social History   Socioeconomic History  . Marital status: Single    Spouse name: Not on file  . Number of children: Not on file  . Years of education: Not on file  . Highest education level: Not on file  Occupational History  . Not on file  Tobacco  Use  . Smoking status: Never Smoker  . Smokeless tobacco: Never Used  . Tobacco comment: occasionally smokes [place]  Substance and Sexual Activity  . Alcohol use: Yes    Comment: occasionally  . Drug use: No  . Sexual activity: Never  Other Topics Concern  . Not on file  Social History Narrative  . Not on file   Social Determinants of Health   Financial Resource Strain:   . Difficulty of Paying Living Expenses: Not on file  Food Insecurity:   . Worried About Charity fundraiser in the Last Year: Not on file  . Ran Out of Food in the Last Year: Not on file  Transportation Needs:   . Lack of Transportation (Medical): Not on file  . Lack of Transportation (Non-Medical): Not on file  Physical Activity:   . Days of  Exercise per Week: Not on file  . Minutes of Exercise per Session: Not on file  Stress:   . Feeling of Stress : Not on file  Social Connections:   . Frequency of Communication with Friends and Family: Not on file  . Frequency of Social Gatherings with Friends and Family: Not on file  . Attends Religious Services: Not on file  . Active Member of Clubs or Organizations: Not on file  . Attends Archivist Meetings: Not on file  . Marital Status: Not on file  Intimate Partner Violence:   . Fear of Current or Ex-Partner: Not on file  . Emotionally Abused: Not on file  . Physically Abused: Not on file  . Sexually Abused: Not on file    Review of Systems: Pertinent positive and negative review of systems were noted in the above HPI section.  All other review of systems was otherwise negative. Physical Exam: Vital signs in last 24 hours: Temp:  [98.8 F (37.1 C)-99.1 F (37.3 C)] 98.8 F (37.1 C) (12/14 0815) Pulse Rate:  [64-122] 84 (12/14 0815) Resp:  [16-20] 18 (12/14 0815) BP: (101-142)/(59-90) 116/81 (12/14 0815) SpO2:  [95 %-100 %] 100 % (12/14 0815) Last BM Date: 03/13/19 General:   Alert,  Well-developed, well-nourished, young African-American female pleasant and cooperative in NAD Head:  Normocephalic and atraumatic. Eyes:  Sclera clear, no icterus.   Conjunctiva pink. Ears:  Normal auditory acuity. Nose:  No deformity, discharge,  or lesions. Mouth:  No deformity or lesions.   Neck:  Supple; no masses or thyromegaly. Lungs:  Clear throughout to auscultation.   No wheezes, crackles, or rhonchi. Heart:  Regular rate and rhythm; no murmurs, clicks, rubs,  or gallops. Abdomen:  Soft, obese,,nontender, BS active,nonpalp mass or hsm.   Rectal:  Deferred  Msk:  Symmetrical without gross deformities. . Pulses:  Normal pulses noted. Extremities:  Without clubbing or edema. Neurologic:  Alert and  oriented x4;  grossly normal neurologically. Skin:  Intact without  significant lesions or rashes.. Psych:  Alert and cooperative. Normal mood and affect.  Intake/Output from previous day: No intake/output data recorded. Intake/Output this shift: No intake/output data recorded.  Lab Results: Recent Labs    03/14/19 0039 03/14/19 0500  WBC 17.2* 13.2*  HGB 14.0 11.3*  HCT 44.9 37.2  PLT 143* 173   BMET Recent Labs    03/13/19 2348 03/14/19 0500  NA 140 140  K 3.7 3.8  CL 104 105  CO2 28 26  GLUCOSE 158* 118*  BUN 9 8  CREATININE 0.50 0.66  CALCIUM 10.2 9.4   LFT Recent Labs  03/14/19 0500  PROT 7.9  ALBUMIN 3.8  AST 246*  ALT 249*  ALKPHOS 85  BILITOT 0.8   PT/INR No results for input(s): LABPROT, INR in the last 72 hours. Hepatitis Panel No results for input(s): HEPBSAG, HCVAB, HEPAIGM, HEPBIGM in the last 72 hours.    IMPRESSION/PLAN :  #39 23 year old African-American female with acute onset of severe epigastric pain radiating through to the back and associated with nausea.  Pain lasted 8 to 9 hours resolved and has not recurred. She had similar but less severe episode in September 2020  Work-up is pertinent for cholelithiasis without definite evidence for cholecystitis the patient did have elevated WBC on admission. She also has a transaminitis without elevation in bilirubin.  CBD is borderline at 6 to 7 mm.  Rule out choledocholithiasis, question passed small stone or sludge  Plan; Initially had planned for MRCP today.  However after discussing with surgical PA, surgery plans to proceed to cholecystectomy later today with IOC once Covid results have returned.  This was discussed with the patient in detail. GI will be available if IOC positive.     Maria Nazir PA-C 03/14/2019, 1:22 PM

## 2019-03-14 NOTE — Transfer of Care (Signed)
Immediate Anesthesia Transfer of Care Note  Patient: Maria Palmer  Procedure(s) Performed: LAPAROSCOPIC CHOLECYSTECTOMY WITH INTRAOPERATIVE CHOLANGIOGRAM (N/A )  Patient Location: PACU  Anesthesia Type:General  Level of Consciousness: awake, alert  and patient cooperative  Airway & Oxygen Therapy: Patient Spontanous Breathing and Patient connected to face mask oxygen  Post-op Assessment: Report given to RN and Post -op Vital signs reviewed and stable  Post vital signs: Reviewed and stable  Last Vitals:  Vitals Value Taken Time  BP 135/68 03/14/19 1630  Temp    Pulse 104 03/14/19 1631  Resp 20 03/14/19 1631  SpO2 100 % 03/14/19 1631  Vitals shown include unvalidated device data.  Last Pain:  Vitals:   03/14/19 1425  TempSrc: Oral  PainSc:          Complications: No apparent anesthesia complications

## 2019-03-14 NOTE — Progress Notes (Signed)
PROGRESS NOTE    Maria Palmer  IPJ:[id number] DOB: [birth year] DOA: 03/13/2019 PCP: Patient, No Pcp Per    Brief Narrative:  23 y/o obese AAF with abdominal pain found to have cholelithiasis     Assessment & Plan:   Active Problems:   Choledocholithiasis  Cholelithiasis Abdominal pain on admission has resolved Right upper quadrant ultrasound showed gallstones and dilated CBD Given elevated LFTs, GI consulted for possible ERCP/ MRCP  Surgery consulted: Plan for cholecystectomy Continue Unasyn cover for intra-abdominal infection for now   DVT prophylaxis: Lovenox SQ Code Status: Full code  Family Communication: Spoke to patient who will discuss will mother  Disposition Plan: Home when stable   Consultants:   GI, surgery  Procedures:  Plan for cholecystectomy  Antimicrobials:   Unasyn   Subjective: Abdominal pain resolved while in the ED.  No recurrence noted.  No nausea, vomiting, diarrhea.  She reports history of gallstones in her mother requiring cholecystectomy.  Does not have any other complaints.  Objective: Vitals:   03/14/19 0600 03/14/19 0630 03/14/19 0700 03/14/19 0815  BP: 105/64 (!) 106/59 101/62 116/81  Pulse: 80 66 64 84  Resp:   18 18  Temp:    98.8 F (37.1 C)  TempSrc:    Oral  SpO2: 99% 99% 96% 100%    Intake/Output Summary (Last 24 hours) at 03/14/2019 1044 Last data filed at 03/14/2019 0848 Gross per 24 hour  Intake 0 ml  Output --  Net 0 ml   There were no vitals filed for this visit.  Examination:  General exam: Appears calm and comfortable  Respiratory system: Clear to auscultation. Respiratory effort normal. Cardiovascular system: S1 & S2 heard, RRR. No JVD, murmurs, rubs, gallops or clicks. No pedal edema. Gastrointestinal system: Abdomen is nondistended, soft and nontender. No organomegaly or masses felt. Normal bowel sounds heard. Central nervous system: Alert and oriented. No focal neurological  deficits. Extremities: Symmetric 5 x 5 power. Skin: No rashes, lesions or ulcers Psychiatry: Judgement and insight appear normal. Mood & affect appropriate.    Data Reviewed: I have personally reviewed following labs and imaging studies  CBC: Recent Labs  Lab 03/14/19 0039 03/14/19 0500  WBC 17.2* 13.2*  HGB 14.0 11.3*  HCT 44.9 37.2  MCV 85.0 84.4  PLT 143* 734   Basic Metabolic Panel: Recent Labs  Lab 03/13/19 2348 03/14/19 0500  NA 140 140  K 3.7 3.8  CL 104 105  CO2 28 26  GLUCOSE 158* 118*  BUN 9 8  CREATININE 0.50 0.66  CALCIUM 10.2 9.4   GFR: CrCl cannot be calculated (Unknown ideal weight.). Liver Function Tests: Recent Labs  Lab 03/13/19 2348 03/14/19 0500  AST 477* 246*  ALT 309* 249*  ALKPHOS 105 85  BILITOT 0.9 0.8  PROT 9.6* 7.9  ALBUMIN 4.7 3.8   Recent Labs  Lab 03/13/19 2348  LIPASE 22   No results for input(s): AMMONIA in the last 168 hours. Coagulation Profile: No results for input(s): INR, PROTIME in the last 168 hours. Cardiac Enzymes: No results for input(s): CKTOTAL, CKMB, CKMBINDEX, TROPONINI in the last 168 hours. BNP (last 3 results) No results for input(s): PROBNP in the last 8760 hours. HbA1C: No results for input(s): HGBA1C in the last 72 hours. CBG: No results for input(s): GLUCAP in the last 168 hours. Lipid Profile: No results for input(s): CHOL, HDL, LDLCALC, TRIG, CHOLHDL, LDLDIRECT in the last 72 hours. Thyroid Function Tests: No results for input(s): TSH, T4TOTAL, FREET4, T3FREE,  THYROIDAB in the last 72 hours. Anemia Panel: No results for input(s): VITAMINB12, FOLATE, FERRITIN, TIBC, IRON, RETICCTPCT in the last 72 hours. Sepsis Labs: No results for input(s): PROCALCITON, LATICACIDVEN in the last 168 hours.  No results found for this or any previous visit (from the past 240 hour(s)).       Radiology Studies: US Abdomen Limited RUQ  Result Date: 03/14/2019 CLINICAL DATA:  Upper abdominal pain. Pain  after eating. Elevated LFTs. EXAM: ULTRASOUND ABDOMEN LIMITED RIGHT UPPER QUADRANT COMPARISON:  None. FINDINGS: Gallbladder: Partially distended. Multiple intraluminal gallstones. No gallbladder wall thickening or pericholecystic fluid. No sonographic Murphy sign noted by sonographer. Common bile duct: Diameter: 6-7 mm, upper normal.  No visualized choledocholithiasis. Liver: No focal lesion identified. Heterogeneous slightly increased in parenchymal echogenicity. Portal vein is patent on color Doppler imaging with normal direction of blood flow towards the liver. Other: None. IMPRESSION: 1. Multiple gallstones. No secondary findings of acute cholecystitis. 2. Upper normal common bile duct measuring 6-7 mm. No visualized choledocholithiasis. Consider further evaluation with MRCP given elevated LFTs. 3. Hepatic steatosis. Electronically Signed   By: Narda Rutherford M.D.   On: 03/14/2019 02:26     Scheduled Meds: . sodium chloride flush  10-40 mL Intracatheter Q12H   Continuous Infusions: . sodium chloride 125 mL/hr at 03/14/19 0854  . ampicillin-sulbactam (UNASYN) IV Stopped (03/14/19 0710)     LOS: 0 days    Time spent: Spent more than 30 minutes in coordinating care for this patient including bedside patient care.   Liborio Nixon, MD [facility] If 7PM-7AM, please contact night-coverage 03/14/2019, 10:44 AM

## 2019-03-14 NOTE — Anesthesia Preprocedure Evaluation (Signed)
Anesthesia Evaluation  Patient identified by MRN, date of birth, ID band Patient awake    Reviewed: Allergy & Precautions, H&P , NPO status , Patient's Chart, lab work & pertinent test results  Airway Mallampati: II   Neck ROM: full    Dental   Pulmonary neg pulmonary ROS,    breath sounds clear to auscultation       Cardiovascular negative cardio ROS   Rhythm:regular Rate:Normal     Neuro/Psych    GI/Hepatic cholelithiasis   Endo/Other  Morbid obesity  Renal/GU      Musculoskeletal   Abdominal   Peds  Hematology   Anesthesia Other Findings   Reproductive/Obstetrics                             Anesthesia Physical Anesthesia Plan  ASA: II  Anesthesia Plan: General   Post-op Pain Management:    Induction: Intravenous  PONV Risk Score and Plan: 3 and Ondansetron, Dexamethasone, Midazolam and Treatment may vary due to age or medical condition  Airway Management Planned: Oral ETT  Additional Equipment:   Intra-op Plan:   Post-operative Plan: Extubation in OR  Informed Consent: I have reviewed the patients History and Physical, chart, labs and discussed the procedure including the risks, benefits and alternatives for the proposed anesthesia with the patient or authorized representative who has indicated his/her understanding and acceptance.       Plan Discussed with: CRNA, Anesthesiologist and Surgeon  Anesthesia Plan Comments:         Anesthesia Quick Evaluation

## 2019-03-14 NOTE — Op Note (Signed)
Maria Palmer [id number] 04/02/1995 03/14/2019  Laparoscopic Cholecystectomy with IOC Procedure Note  Indications: This patient presents with symptomatic gallbladder disease and will undergo laparoscopic cholecystectomy.  Pre-operative Diagnosis: symptomatic cholelithiasis  Post-operative Diagnosis: Calculus of gallbladder with other cholecystitis, without mention of obstruction  Surgeon: Greer Pickerel MD FACS  Assistants: Saverio Danker PA-C  Anesthesia: General endotracheal anesthesia  Procedure Details  The patient was seen again in the Holding Room. The risks, benefits, complications, treatment options, and expected outcomes were discussed with the patient. The possibilities of reaction to medication, pulmonary aspiration, perforation of viscus, bleeding, recurrent infection, finding a normal gallbladder, the need for additional procedures, failure to diagnose a condition, the possible need to convert to an open procedure, and creating a complication requiring transfusion or operation were discussed with the patient. The likelihood of improving the patient's symptoms with return to their baseline status is good.  The patient and/or family concurred with the proposed plan, giving informed consent. The site of surgery properly noted. The patient was taken to Operating Room, identified as Maria Palmer and the procedure verified as Laparoscopic Cholecystectomy with Intraoperative Cholangiogram. A Time Out was held and the above information confirmed. Antibiotic prophylaxis was administered.   Prior to the induction of general anesthesia, antibiotic prophylaxis was administered. General endotracheal anesthesia was then administered and tolerated well. After the induction, the abdomen was prepped with Chloraprep and draped in the sterile fashion. The patient was positioned in the supine position.  Local anesthetic agent was injected into the skin near the umbilicus and an incision made. We  dissected down to the abdominal fascia with blunt dissection.  The fascia was incised vertically and we entered the peritoneal cavity bluntly.  A pursestring suture of 0-Vicryl was placed around the fascial opening.  The Hasson cannula was inserted and secured with the stay suture.  Pneumoperitoneum was then created with CO2 and tolerated well without any adverse changes in the patient's vital signs. An 5-mm port was placed in the subxiphoid position.  Two 5-mm ports were placed in the right upper quadrant. All skin incisions were infiltrated with a local anesthetic agent before making the incision and placing the trocars.   We positioned the patient in reverse Trendelenburg, tilted slightly to the patient's left.  The gallbladder was identified, the fundus grasped and retracted cephalad. Adhesions were lysed bluntly and with the electrocautery where indicated, taking care not to injure any adjacent organs or viscus. The infundibulum was grasped and retracted laterally, exposing the peritoneum overlying the triangle of Calot. This was then divided and exposed in a blunt fashion. A critical view of the cystic duct and cystic artery was obtained.  The cystic duct was clearly identified and bluntly dissected circumferentially. The cystic duct was ligated with a clip distally.   An incision was made in the cystic duct and the [facility] cholangiogram catheter introduced. The catheter was secured using a clip. A cholangiogram was then obtained which showed good visualization of the distal and proximal biliary tree with no sign of filling defects or obstruction.  Contrast flowed easily into the duodenum. The catheter was then removed.   The cystic duct was then ligated with clips and divided. The cystic artery which had been identified & dissected free was ligated with clips and divided as well.   The gallbladder was dissected from the liver bed in retrograde fashion with the electrocautery. There was spillage of bile  from the gallbladder but no stone spillage. The gallbladder was removed and placed in  an Ecco sac. The liver bed was irrigated and inspected. Hemostasis was achieved with the electrocautery.   Copious irrigation was utilized and was repeatedly aspirated until clear. The gallbladder and Ecco sac were then removed through the umbilical port site.  I had to enlarge the fascial incision at the umbilicus in order to extract the gallbladder in the sac.  I did open up the gallbladder and manually remove the gallstones in order to completely get it out of the abdomen.  An interrupted 0 Vicryl suture was placed at the umbilical fascia.  The trocar was replaced and pneumoperitoneum was reestablished.  The right upper quadrant was reinspected.  No evidence of bleeding or bile leak.  The [facility] trocar was removed and the previously placed erupted suture was tied down.  There was still an air leak as expected.  3 additional interrupted sutures were placed using a PMI suture passer with laparoscopic guidance.  There was no air leak.  There is no fascial defect.  Nothing was trapped within the closure.  Local was infiltrated in the area       Pneumoperitoneum was released as we removed the trocars.  The umbilical skin incision and wound was irrigated with saline.  4-0 Monocryl was used to close the skin.   Benzoin, steri-strips, and clean dressings were applied. The patient was then extubated and brought to the recovery room in stable condition. Instrument, sponge, and needle counts were correct at closure and at the conclusion of the case.   Findings: Chronic Cholecystitis with Cholelithiasis No filling defect on ioc  Estimated Blood Loss: Minimal         Drains: none         Specimens: Gallbladder           Complications: None; patient tolerated the procedure well.         Disposition: PACU - hemodynamically stable.         Condition: stable  Mary Sella. Andrey Campanile, MD, FACS General, Bariatric, & Minimally Invasive  Surgery [facility], [place]

## 2019-03-14 NOTE — Consult Note (Signed)
Maria Palmer 1995/06/20  [id number].    Requesting MD: Dr. Raford Pitcher Chief Complaint/Reason for Consult: biliary colic  HPI:  This is a pleasant obese 23 yo black female with no PMH.  Earlier this year in September she had an episode of epigastric abdominal pain that radiated to her back after eating.  She was seen an an urgent care and diagnosed with PUD vs biliary disease.  She was treated for PUD.  She has been fine until yesterday she ate pancakes, bacon, and eggs.  A short time later she began having acute epigastric abdominal pain that radiated through to her back.  She developed nausea but unable to vomit.  She denies any fevers, cough, chills, SOB, COVID exposure, dysuria, etc.  She presented to the [facility] for evaluation where she had an ultrasound that revealed gallstones but no evidence of cholecystitis.  Her CBD was felt to be slightly dilated at 6-42mm but no choledocholithiasis was seen.  Her WBC was 17K on admit and down to 13K today.  Her AST/ALT were in the 400s, but down today as well.  GI evaluated the patient and recommended an MRCP.  We were also consulted.  ROS: ROS: Please see HPI, otherwise all other systems have been reviewed and are negative.  Family History  Problem Relation Age of Onset  . Hypertension Mother   . Hypertension Father     History reviewed. No pertinent past medical history.  History reviewed. No pertinent surgical history.  Social History:  reports that she has never smoked. She has never used smokeless tobacco. She reports current alcohol use. She reports that she does not use drugs.  Allergies: No Known Allergies  No medications prior to admission.     Physical Exam: Blood pressure 116/81, pulse 84, temperature 98.8 F (37.1 C), temperature source Oral, resp. rate 18, last menstrual period 02/21/2019, SpO2 100 %. General: pleasant, WD, WN, but somewhat obese black female who is laying in bed in NAD HEENT: head is normocephalic,  atraumatic.  Sclera are noninjected.  PERRL.  Ears and nose without any masses or lesions.  Mouth is pink and moist Heart: regular, rate, and rhythm.  Normal s1,s2. No obvious murmurs, gallops, or rubs noted.  Palpable radial and pedal pulses bilaterally Lungs: CTAB, no wheezes, rhonchi, or rales noted.  Respiratory effort nonlabored Abd: soft, NT, ND, +BS, no masses, hernias, or organomegaly MS: all 4 extremities are symmetrical with no cyanosis, clubbing, or edema. Skin: warm and dry with no masses, lesions, or rashes Psych: A&Ox3 with an appropriate affect.   Results for orders placed or performed during the hospital encounter of 03/13/19 (from the past 48 hour(s))  Urinalysis, Routine w reflex microscopic     Status: Abnormal   Collection Time: 03/13/19 10:36 PM  Result Value Ref Range   Color, Urine AMBER (A) YELLOW    Comment: BIOCHEMICALS MAY BE AFFECTED BY COLOR   APPearance HAZY (A) CLEAR   Specific Gravity, Urine 1.030 1.005 - 1.030   pH 7.0 5.0 - 8.0   Glucose, UA NEGATIVE NEGATIVE mg/dL   Hgb urine dipstick NEGATIVE NEGATIVE   Bilirubin Urine NEGATIVE NEGATIVE   Ketones, ur NEGATIVE NEGATIVE mg/dL   Protein, ur 409 (A) NEGATIVE mg/dL   Nitrite NEGATIVE NEGATIVE   Leukocytes,Ua TRACE (A) NEGATIVE   RBC / HPF 0-5 0 - 5 RBC/hpf   WBC, UA 0-5 0 - 5 WBC/hpf   Bacteria, UA NONE SEEN NONE SEEN   Squamous  Epithelial / LPF 11-20 0 - 5   Mucus PRESENT     Comment: Performed at [facility], [place] [address], [place], [place] [address]  Lipase, blood     Status: None   Collection Time: 03/13/19 11:48 PM  Result Value Ref Range   Lipase 22 11 - 51 U/L    Comment: Performed at [facility], [place] [address], [place], [place] [address]  Comprehensive metabolic panel     Status: Abnormal   Collection Time: 03/13/19 11:48 PM  Result Value Ref Range   Sodium 140 135 - 145 mmol/L   Potassium 3.7 3.5 - 5.1 mmol/L   Chloride 104 98 - 111 mmol/L    CO2 28 22 - 32 mmol/L   Glucose, Bld 158 (H) 70 - 99 mg/dL   BUN 9 6 - 20 mg/dL   Creatinine, Ser 0.50 0.44 - 1.00 mg/dL   Calcium 10.2 8.9 - 10.3 mg/dL   Total Protein 9.6 (H) 6.5 - 8.1 g/dL   Albumin 4.7 3.5 - 5.0 g/dL   AST 477 (H) 15 - 41 U/L   ALT 309 (H) 0 - 44 U/L   Alkaline Phosphatase 105 38 - 126 U/L   Total Bilirubin 0.9 0.3 - 1.2 mg/dL   GFR calc non Af Amer >60 >60 mL/min   GFR calc Af Amer >60 >60 mL/min   Anion gap 8 5 - 15    Comment: Performed at [facility], [place] [address] [place], [place] [address]  I-Stat beta hCG blood, ED     Status: None   Collection Time: 03/14/19 12:06 AM  Result Value Ref Range   I-stat hCG, quantitative <5.0 <5 mIU/mL   Comment 3            Comment:   GEST. AGE      CONC.  (mIU/mL)   <=1 WEEK        5 - 50     2 WEEKS       50 - 500     3 WEEKS       100 - 10,000     4 WEEKS     1,000 - 30,000        FEMALE AND NON-PREGNANT FEMALE:     LESS THAN 5 mIU/mL   CBC     Status: Abnormal   Collection Time: 03/14/19 12:39 AM  Result Value Ref Range   WBC 17.2 (H) 4.0 - 10.5 K/uL   RBC 5.28 (H) 3.87 - 5.11 MIL/uL   Hemoglobin 14.0 12.0 - 15.0 g/dL   HCT 44.9 36.0 - 46.0 %   MCV 85.0 80.0 - 100.0 fL   MCH 26.5 26.0 - 34.0 pg   MCHC 31.2 30.0 - 36.0 g/dL   RDW 14.2 11.5 - 15.5 %   Platelets 143 (L) 150 - 400 K/uL   nRBC 0.0 0.0 - 0.2 %    Comment: Performed at [facility], [place] [address], [place], [place] [address]  HIV Antibody (routine testing w rflx)     Status: None   Collection Time: 03/14/19  3:08 AM  Result Value Ref Range   HIV Screen 4th Generation wRfx NON REACTIVE NON REACTIVE    Comment: Performed at [place] [address] [place], [place] [address]  Comprehensive metabolic panel     Status: Abnormal   Collection Time: 03/14/19  5:00 AM  Result Value Ref Range   Sodium  140 135 - 145 mmol/L   Potassium 3.8 3.5 - 5.1 mmol/L   Chloride 105 98 - 111 mmol/L   CO2 26 22 -  32 mmol/L   Glucose, Bld 118 (H) 70 - 99 mg/dL   BUN 8 6 - 20 mg/dL   Creatinine, Ser 1.610.66 0.44 - 1.00 mg/dL   Calcium 9.4 8.9 - 09.610.3 mg/dL   Total Protein 7.9 6.5 - 8.1 g/dL   Albumin 3.8 3.5 - 5.0 g/dL   AST 045246 (H) 15 - 41 U/L   ALT 249 (H) 0 - 44 U/L   Alkaline Phosphatase 85 38 - 126 U/L   Total Bilirubin 0.8 0.3 - 1.2 mg/dL   GFR calc non Af Amer >60 >60 mL/min   GFR calc Af Amer >60 >60 mL/min   Anion gap 9 5 - 15    Comment: Performed at [facility], [address] [place], [place] [address]  CBC     Status: Abnormal   Collection Time: 03/14/19  5:00 AM  Result Value Ref Range   WBC 13.2 (H) 4.0 - 10.5 K/uL   RBC 4.41 3.87 - 5.11 MIL/uL   Hemoglobin 11.3 (L) 12.0 - 15.0 g/dL   HCT 19.137.2 47.836.0 - 29.546.0 %   MCV 84.4 80.0 - 100.0 fL   MCH 25.6 (L) 26.0 - 34.0 pg   MCHC 30.4 30.0 - 36.0 g/dL   RDW 62.114.2 30.811.5 - 65.715.5 %   Platelets 173 150 - 400 K/uL   nRBC 0.0 0.0 - 0.2 %    Comment: Performed at [facility], [address] [place], [place] [address]   US Abdomen Limited RUQ  Result Date: 03/14/2019 CLINICAL DATA:  Upper abdominal pain. Pain after eating. Elevated LFTs. EXAM: ULTRASOUND ABDOMEN LIMITED RIGHT UPPER QUADRANT COMPARISON:  None. FINDINGS: Gallbladder: Partially distended. Multiple intraluminal gallstones. No gallbladder wall thickening or pericholecystic fluid. No sonographic Murphy sign noted by sonographer. Common bile duct: Diameter: 6-7 mm, upper normal.  No visualized choledocholithiasis. Liver: No focal lesion identified. Heterogeneous slightly increased in parenchymal echogenicity. Portal vein is patent on color Doppler imaging with normal direction of blood flow towards the liver. Other: None. IMPRESSION: 1. Multiple gallstones. No secondary findings of acute cholecystitis. 2. Upper normal common bile duct measuring 6-7 mm. No visualized choledocholithiasis. Consider further evaluation with MRCP given elevated LFTs. 3.  Hepatic steatosis. Electronically Signed   By: Narda RutherfordMelanie  Sanford M.D.   On: 03/14/2019 02:26      Assessment/Plan Biliary colic The patient appears to have had biliary colic as her pain has resolved on its own.  I have a low suspicion for choledocholithiasis at this point.  She may have passed a stone in the interim.  Either way, we will proceed with a lap chole and IOC today and cancel her MRCP.  We will contact GI if choledocholithiasis is found on IOC.   I have explained the procedure, risks, and aftercare of cholecystectomy.  Risks include but are not limited to bleeding, infection, wound problems, anesthesia, diarrhea, bile leak, injury to common bile duct/liver/intestine.  She seems to understand and agrees to proceed. We have also taken the patient onto our service and this was discussed with the hospitalist.  COVID test pending.  OR once this returns.   FEN - NPO VTE - Lovenox post op ID - [email or web address]  Letha CapeKelly E Tyrus Wilms, [facility] 03/14/2019, 1:16 PM Please see Amion for pager number during day hours 7:00am-4:30pm

## 2019-03-15 ENCOUNTER — Encounter: Payer: Self-pay | Admitting: *Deleted

## 2019-03-15 MED ORDER — ACETAMINOPHEN 500 MG PO TABS: 1000.0000 mg | ORAL_TABLET | Freq: Four times a day (QID) | ORAL | 2 refills | Status: AC | PRN

## 2019-03-15 MED ORDER — IBUPROFEN 600 MG PO TABS: 600.0000 mg | ORAL_TABLET | Freq: Three times a day (TID) | ORAL | 0 refills | Status: AC

## 2019-03-15 MED ORDER — OXYCODONE HCL 5 MG PO TABS: 5.0000 mg | ORAL_TABLET | ORAL | 0 refills | Status: AC | PRN

## 2019-03-15 NOTE — Discharge Instructions (Signed)
[facility] ° °LAPAROSCOPIC SURGERY: POST OP INSTRUCTIONS °Always review your discharge instruction sheet given to you by the facility where your surgery was performed. °IF YOU HAVE DISABILITY OR FAMILY LEAVE FORMS, YOU MUST BRING THEM TO THE OFFICE FOR PROCESSING.   °DO NOT GIVE THEM TO YOUR DOCTOR. ° °PAIN CONTROL ° °1. First take acetaminophen (Tylenol) AND/or ibuprofen (Advil) to control your pain after surgery.  Follow directions on package.  Taking acetaminophen (Tylenol) and/or ibuprofen (Advil) regularly after surgery will help to control your pain and lower the amount of prescription pain medication you may need.  You should not take more than 4,000 mg (4 grams) of acetaminophen (Tylenol) in 24 hours.  You should not take ibuprofen (Advil), aleve, motrin, naprosyn or other NSAIDS if you have a history of stomach ulcers or chronic kidney disease.  °2. A prescription for pain medication may be given to you upon discharge.  Take your pain medication as prescribed, if you still have uncontrolled pain after taking acetaminophen (Tylenol) or ibuprofen (Advil). °3. Use ice packs to help control pain. °4. If you need a refill on your pain medication, please contact your pharmacy.  They will contact our office to request authorization. Prescriptions will not be filled after 5pm or on week-ends. ° °HOME MEDICATIONS °5. Take your usually prescribed medications unless otherwise directed. ° °DIET °6. You should follow a light diet the first few days after arrival home.  Be sure to include lots of fluids daily. Avoid fatty, fried foods.  ° °CONSTIPATION °7. It is common to experience some constipation after surgery and if you are taking pain medication.  Increasing fluid intake and taking a stool softener (such as Colace) will usually help or prevent this problem from occurring.  A mild laxative (Milk of Magnesia or Miralax) should be taken according to package instructions if there are no bowel  movements after 48 hours. ° °WOUND/INCISION CARE °8. Most patients will experience some swelling and bruising in the area of the incisions.  Ice packs will help.  Swelling and bruising can take several days to resolve.  °9. Unless discharge instructions indicate otherwise, follow guidelines below  °a. STERI-STRIPS - you may remove your outer bandages 48 hours after surgery, and you may shower at that time.  You have steri-strips (small skin tapes) in place directly over the incision.  These strips should be left on the skin for 7-10 days.   °b. DERMABOND/SKIN GLUE - you may shower in 24 hours.  The glue will flake off over the next 2-3 weeks. °10. Any sutures or staples will be removed at the office during your follow-up visit. ° °ACTIVITIES °11. You may resume regular (light) daily activities beginning the next day--such as daily self-care, walking, climbing stairs--gradually increasing activities as tolerated.  You may have sexual intercourse when it is comfortable.  Refrain from any heavy lifting or straining until approved by your doctor. °a. You may drive when you are no longer taking prescription pain medication, you can comfortably wear a seatbelt, and you can safely maneuver your car and apply brakes. ° °FOLLOW-UP °12. You should see your doctor in the office for a follow-up appointment approximately 2-3 weeks after your surgery.  You should have been given your post-op/follow-up appointment when your surgery was scheduled.  If you did not receive a post-op/follow-up appointment, make sure that you call for this appointment within a day or two after you arrive home to insure a convenient appointment time. ° ° °  WHEN TO CALL YOUR DOCTOR: °1. Fever over 101.0 °2. Inability to urinate °3. Continued bleeding from incision. °4. Increased pain, redness, or drainage from the incision. °5. Increasing abdominal pain ° °The clinic staff is available to answer your questions during regular business hours.  Please don’t  hesitate to call and ask to speak to one of the nurses for clinical concerns.  If you have a medical emergency, go to the nearest emergency room or call 911.  A surgeon from [facility] is always on call at the hospital. °[address], [address], [place], [place]  [address] ? [address] [address], [place], [place]   [address] °[phone] ? [phone] ? FAX [phone] ° °••••••••• ° ° °Managing Your Pain After Surgery Without Opioids ° ° ° °Thank you for participating in our program to help patients manage their pain after surgery without opioids. This is part of our effort to provide you with the best care possible, without exposing you or your family to the risk that opioids pose. ° °What pain can I expect after surgery? °You can expect to have some pain after surgery. This is normal. The pain is typically worse the day after surgery, and quickly begins to get better. °Many studies have found that many patients are able to manage their pain after surgery with Over-the-Counter (OTC) medications such as Tylenol and Motrin. If you have a condition that does not allow you to take Tylenol or Motrin, notify your surgical team. ° °How will I manage my pain? °The best strategy for controlling your pain after surgery is around the clock pain control with Tylenol (acetaminophen) and Motrin (ibuprofen or Advil). Alternating these medications with each other allows you to maximize your pain control. In addition to Tylenol and Motrin, you can use heating pads or ice packs on your incisions to help reduce your pain. ° °How will I alternate your regular strength over-the-counter pain medication? °You will take a dose of pain medication every three hours. °; Start by taking 650 mg of Tylenol (2 pills of 325 mg) °; 3 hours later take 600 mg of Motrin (3 pills of 200 mg) °; 3 hours after taking the Motrin take 650 mg of Tylenol °; 3 hours after that take 600 mg of Motrin. ° ° °- 1 - ° °See example - if your first  dose of Tylenol is at 12:00 PM ° ° °12:00 PM Tylenol 650 mg (2 pills of 325 mg)  °3:00 PM Motrin 600 mg (3 pills of 200 mg)  °6:00 PM Tylenol 650 mg (2 pills of 325 mg)  °9:00 PM Motrin 600 mg (3 pills of 200 mg)  °Continue alternating every 3 hours  ° °We recommend that you follow this schedule around-the-clock for at least 3 days after surgery, or until you feel that it is no longer needed. Use the table on the last page of this handout to keep track of the medications you are taking. °Important: °Do not take more than 3000mg of Tylenol or 3200mg of Motrin in a 24-hour period. °Do not take ibuprofen/Motrin if you have a history of bleeding stomach ulcers, severe kidney disease, &/or actively taking a blood thinner ° °What if I still have pain? °If you have pain that is not controlled with the over-the-counter pain medications (Tylenol and Motrin or Advil) you might have what we call “breakthrough” pain. You will receive a prescription for a small amount of an opioid pain medication such as Oxycodone, Tramadol, or   Tylenol with Codeine. Use these opioid pills in the first 24 hours after surgery if you have breakthrough pain. Do not take more than 1 pill every 4-6 hours. ° °If you still have uncontrolled pain after using all opioid pills, don't hesitate to call our staff using the number provided. We will help make sure you are managing your pain in the best way possible, and if necessary, we can provide a prescription for additional pain medication. ° ° °Day 1   ° °Time  °Name of Medication Number of pills taken  °Amount of Acetaminophen  °Pain Level  ° °Comments  °AM PM       °AM PM       °AM PM       °AM PM       °AM PM       °AM PM       °AM PM       °AM PM       °Total Daily amount of Acetaminophen °Do not take more than  3,000 mg per day    ° ° °Day 2   ° °Time  °Name of Medication Number of pills °taken  °Amount of Acetaminophen  °Pain Level  ° °Comments  °AM PM       °AM PM       °AM PM       °AM PM       °AM  PM       °AM PM       °AM PM       °AM PM       °Total Daily amount of Acetaminophen °Do not take more than  3,000 mg per day    ° ° °Day 3   ° °Time  °Name of Medication Number of pills taken  °Amount of Acetaminophen  °Pain Level  ° °Comments  °AM PM       °AM PM       °AM PM       °AM PM       ° ° ° °AM PM       °AM PM       °AM PM       °AM PM       °Total Daily amount of Acetaminophen °Do not take more than  3,000 mg per day    ° ° °Day 4   ° °Time  °Name of Medication Number of pills taken  °Amount of Acetaminophen  °Pain Level  ° °Comments  °AM PM       °AM PM       °AM PM       °AM PM       °AM PM       °AM PM       °AM PM       °AM PM       °Total Daily amount of Acetaminophen °Do not take more than  3,000 mg per day    ° ° °Day 5   ° °Time  °Name of Medication Number °of pills taken  °Amount of Acetaminophen  °Pain Level  ° °Comments  °AM PM       °AM PM       °AM PM       °AM PM       °AM PM       °AM PM       °AM PM       °  AM PM       °Total Daily amount of Acetaminophen °Do not take more than  3,000 mg per day    ° ° ° °Day 6   ° °Time  °Name of Medication Number of pills °taken  °Amount of Acetaminophen  °Pain Level  °Comments  °AM PM       °AM PM       °AM PM       °AM PM       °AM PM       °AM PM       °AM PM       °AM PM       °Total Daily amount of Acetaminophen °Do not take more than  3,000 mg per day    ° ° °Day 7   ° °Time  °Name of Medication Number of pills taken  °Amount of Acetaminophen  °Pain Level  ° °Comments  °AM PM       °AM PM       °AM PM       °AM PM       °AM PM       °AM PM       °AM PM       °AM PM       °Total Daily amount of Acetaminophen °Do not take more than  3,000 mg per day    ° ° ° ° °For additional information about how and where to safely dispose of unused opioid °medications - https://www.morepowerfulnc.org ° °Disclaimer: This document contains information and/or instructional materials adapted from [facility] for the typical patient with your condition. It does  not replace medical advice from your health care provider because your experience may differ from that of the °typical patient. Talk to your health care provider if you have any questions about this °document, your condition or your treatment plan. °Adapted from [facility] ° ° °

## 2019-03-15 NOTE — Discharge Summary (Signed)
    Patient ID: Maria Palmer [id number] [birth year] 23 y.o.  Admit date: 03/13/2019 Discharge date: 03/15/2019  Admitting Diagnosis: Biliary colic  Discharge Diagnosis Patient Active Problem List   Diagnosis Date Noted  . Choledocholithiasis 03/14/2019  . Gallstones   . Transaminitis   . Upper abdominal pain     Consultants GI, General surgery  Reason for Admission: Maria Palmer is a 23 y.o. female with medical history significant of PUD.  Patient presents to ED with c/o abd pain.  Went out to eat breakfast with friends today.  Shortly after had onset of epigastric abd pain.  Sharp, stabbing, some radiation to back.  Had nausea but no vomiting.  No fever/chills.  Procedures Lap chole with IOC, Dr. Redmond Pulling 12/14  Hospital Course:  The patient was admitted and underwent a laparoscopic cholecystectomy with IOC.  The patient tolerated the procedure well.  On POD 1, the patient was tolerating a regular diet, voiding well, mobilizing, and pain was controlled with oral pain medications.  The patient was stable for DC home at this time with appropriate follow up made.     Physical Exam: Abd: soft, appropriately tender, +BS, obese, ND, incisions are c/d/i with steri-strips and bandaids present  Allergies as of 03/15/2019   No Known Allergies     Medication List    TAKE these medications   acetaminophen 500 MG tablet Commonly known as: TYLENOL Take 2 tablets (1,000 mg total) by mouth every 6 (six) hours as needed.   ibuprofen 600 MG tablet Commonly known as: ADVIL Take 1 tablet (600 mg total) by mouth 3 (three) times daily.   oxyCODONE 5 MG immediate release tablet Commonly known as: Oxy IR/ROXICODONE Take 1 tablet (5 mg total) by mouth every 4 (four) hours as needed for moderate pain.        Follow-up Information    [facility] [place] Follow up on 04/05/2019.   Specialty: General Surgery Why: 11 am. this will be a phone call for your appointment.  If  you have any concerns about your incisions please email a photo to [email or web address] Contact information: [address] [place] [address] [phone]           Signed: Saverio Danker, [facility] 03/15/2019, 9:19 AM Please see Amion for pager number during day hours 7:00am-4:30pm

## 2019-03-15 NOTE — Progress Notes (Signed)
Discharge and medication instructions reviewed with patient. Questions answered and patient denies further questions. No prescriptions given. Sister is here to drive patient home. Donne Hazel, RN

## 2019-03-16 LAB — SURGICAL PATHOLOGY

## 2019-03-16 NOTE — Anesthesia Postprocedure Evaluation (Signed)
Anesthesia Post Note  Patient: Maria Palmer  Procedure(s) Performed: LAPAROSCOPIC CHOLECYSTECTOMY WITH INTRAOPERATIVE CHOLANGIOGRAM (N/A )     Patient location during evaluation: PACU Anesthesia Type: General Level of consciousness: awake and alert Pain management: pain level controlled Vital Signs Assessment: post-procedure vital signs reviewed and stable Respiratory status: spontaneous breathing, nonlabored ventilation, respiratory function stable and patient connected to nasal cannula oxygen Cardiovascular status: blood pressure returned to baseline and stable Postop Assessment: no apparent nausea or vomiting Anesthetic complications: no    Last Vitals:  Vitals:   03/15/19 0105 03/15/19 0553  BP: 126/66 110/75  Pulse: (!) 57 69  Resp: 18 18  Temp: 37.3 C 36.9 C  SpO2: 100% 99%    Last Pain:  Vitals:   03/15/19 0700  TempSrc:   PainSc: Asleep                 Denissa Cozart S

## 2019-04-22 ENCOUNTER — Ambulatory Visit: Payer: BC Managed Care – PPO | Attending: Internal Medicine

## 2019-04-22 DIAGNOSIS — Z20822 Contact with and (suspected) exposure to covid-19: Secondary | ICD-10-CM

## 2019-04-23 LAB — NOVEL CORONAVIRUS, NAA: SARS-CoV-2, NAA: NOT DETECTED

## 2021-04-27 IMAGING — US US ABDOMEN LIMITED
1 series · 14 of 25 positions shown · non-contrast
Comparison: None.

CLINICAL DATA: Upper abdominal pain. Pain after eating. Elevated
LFTs.

EXAM:
ULTRASOUND ABDOMEN LIMITED RIGHT UPPER QUADRANT

[Series 1: us abdomen limited · 14 of 60 slices shown]
[im 1/60]
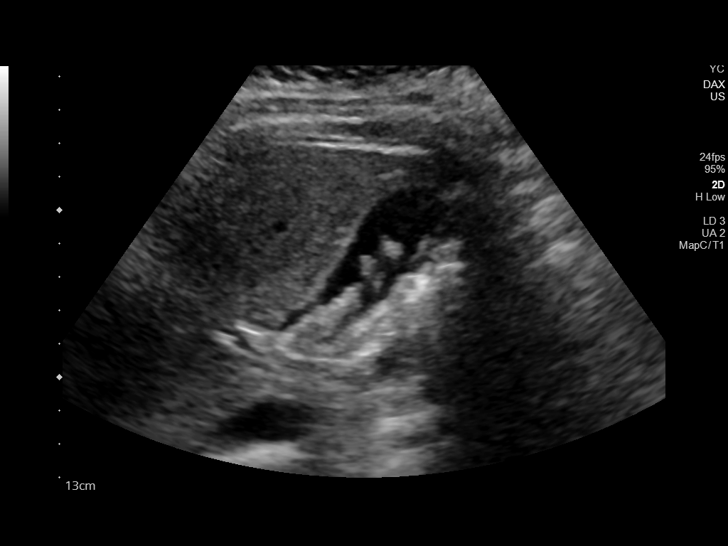
[im 5/60]
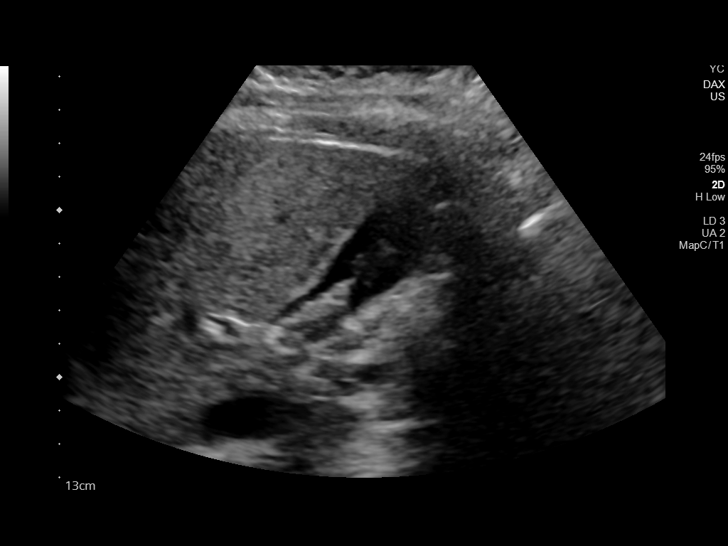
[im 10/60]
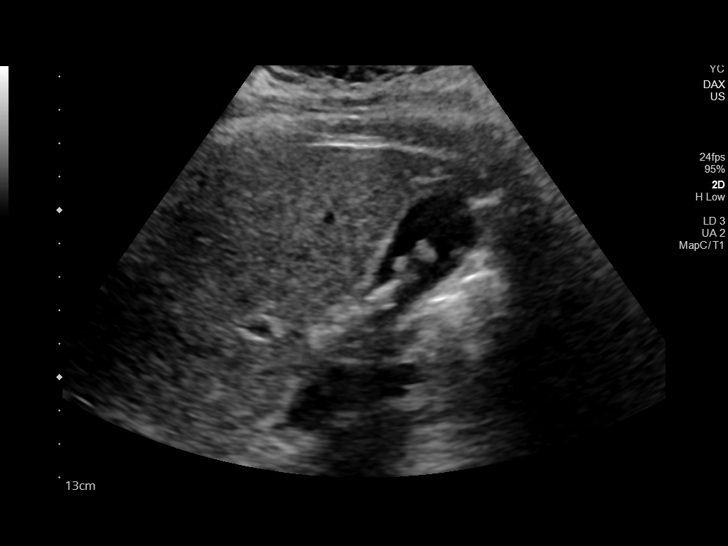
[im 15/60]
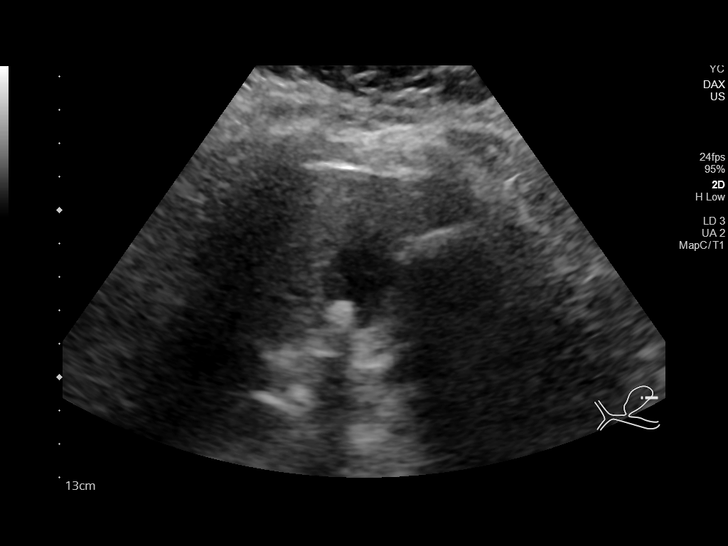
[im 20/60]
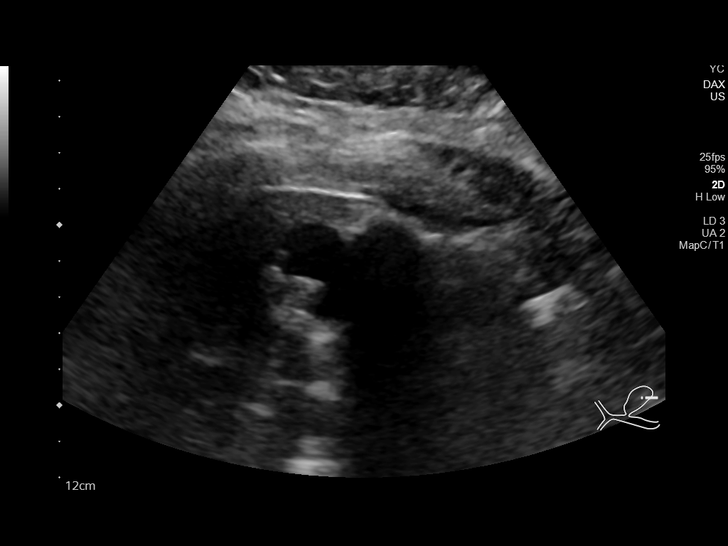
[im 23/60]
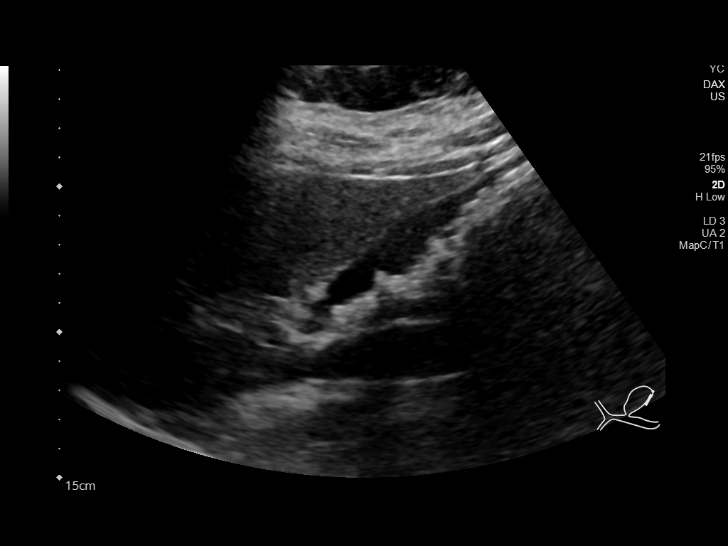
[im 28/60]
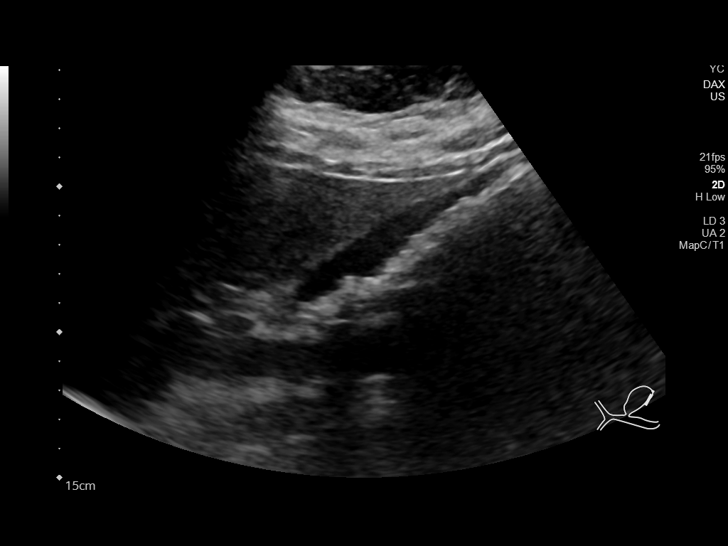
[im 32/60]
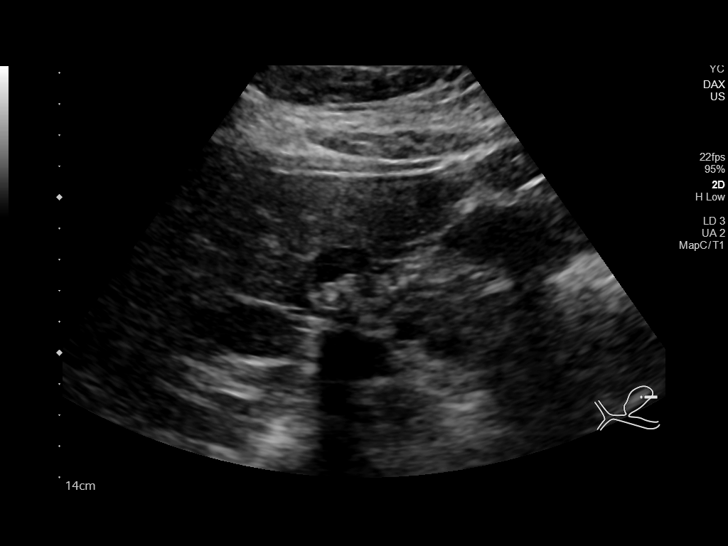
[im 37/60]
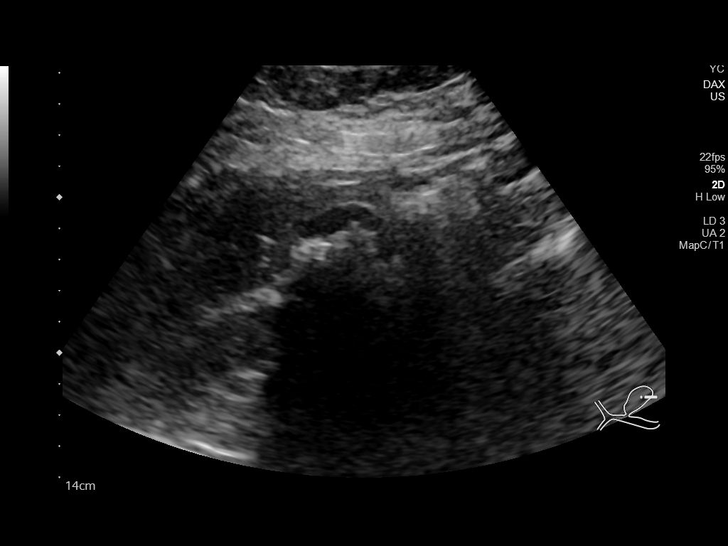
[im 40/60]
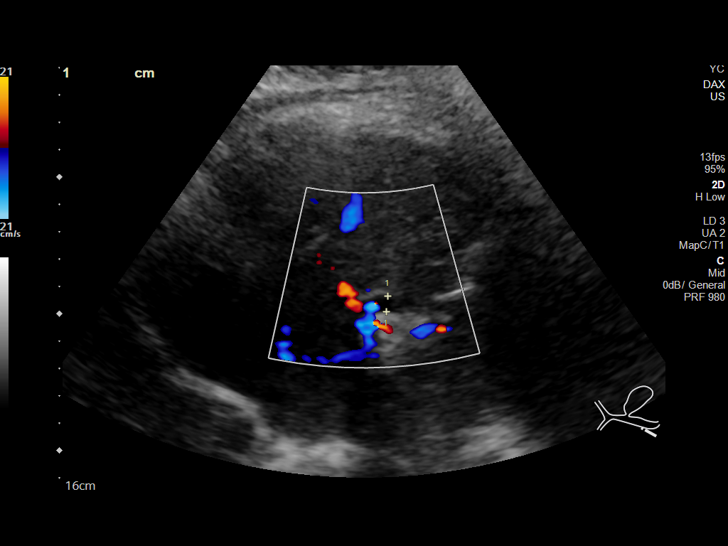
[im 45/60]
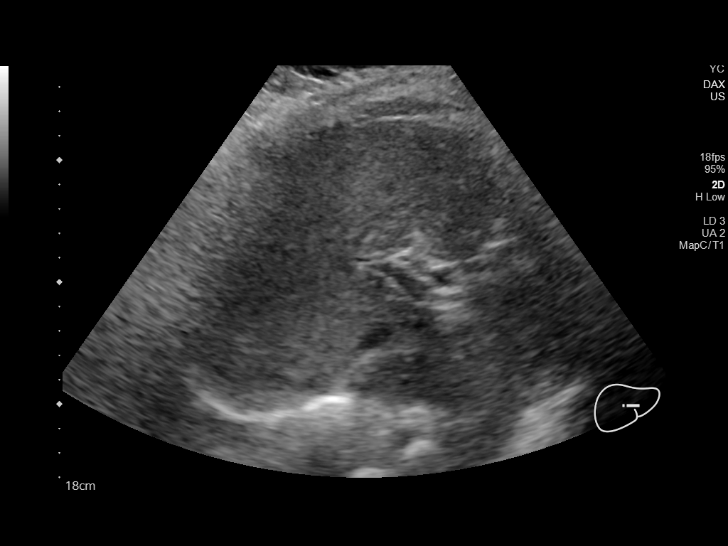
[im 50/60]
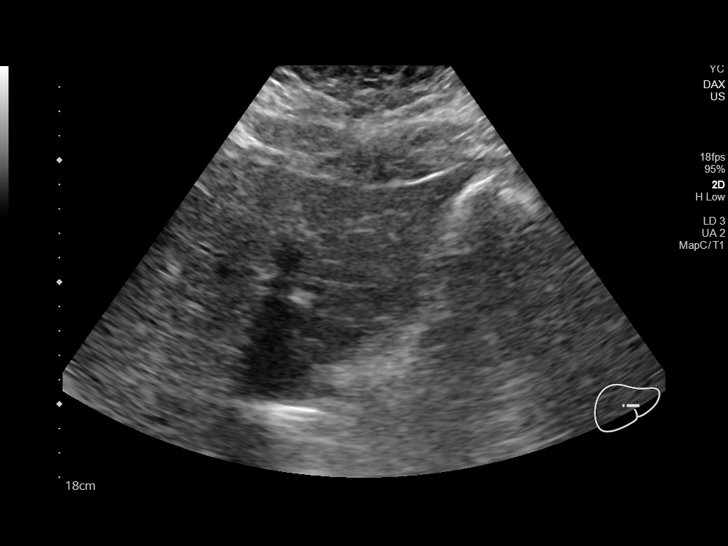
[im 55/60]
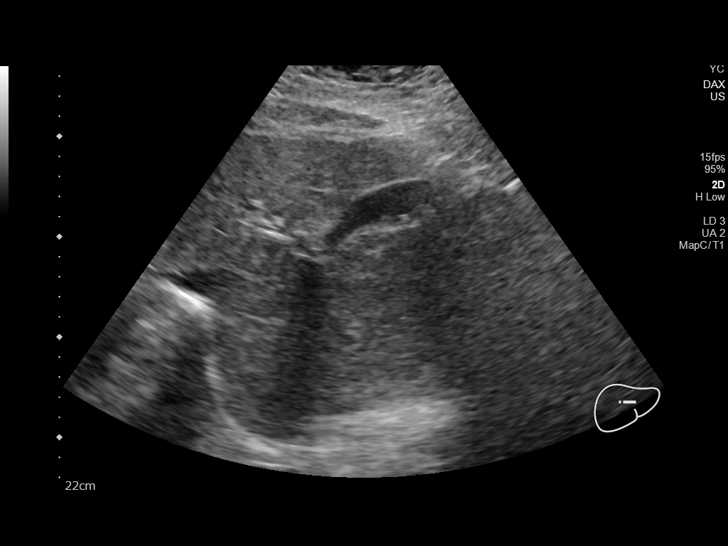
[im 60/60]
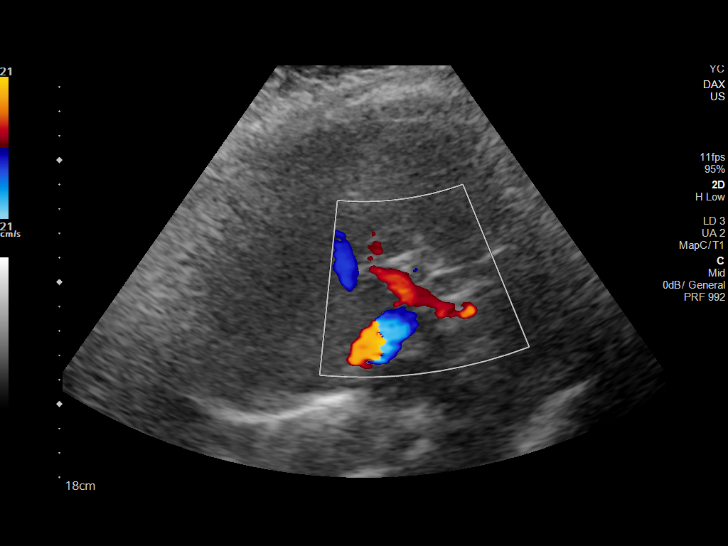

[14 of 25 positions shown; findings below may reference images not displayed]

FINDINGS: Gallbladder:

Partially distended. Multiple intraluminal gallstones. No
gallbladder wall thickening or pericholecystic fluid. No sonographic
Murphy sign noted by sonographer.

Common bile duct:

Diameter: 6-7 mm, upper normal.  No visualized choledocholithiasis.

Liver:

No focal lesion identified. Heterogeneous slightly increased in
parenchymal echogenicity. Portal vein is patent on color Doppler
imaging with normal direction of blood flow towards the liver.

Other: None.
IMPRESSION: 1. Multiple gallstones. No secondary findings of acute
cholecystitis.
2. Upper normal common bile duct measuring 6-7 mm. No visualized
choledocholithiasis. Consider further evaluation with MRCP given
elevated LFTs.
3. Hepatic steatosis.
